# Patient Record
Sex: Male | Born: 1965 | Race: Black or African American | Hispanic: No | Marital: Married | Smoking: Never smoker
Health system: Southern US, Community
[De-identification: ages and names within clinical notes are randomized; demographics above are authoritative.]

## PROBLEM LIST (undated history)

## (undated) DIAGNOSIS — I10 Essential (primary) hypertension: Secondary | ICD-10-CM

## (undated) DIAGNOSIS — L409 Psoriasis, unspecified: Secondary | ICD-10-CM

---

## 2010-01-04 ENCOUNTER — Encounter: Admission: RE | Admit: 2010-01-04 | Discharge: 2010-01-04 | Payer: Self-pay | Admitting: Dermatology

## 2016-07-09 ENCOUNTER — Emergency Department (HOSPITAL_COMMUNITY)
Admission: EM | Admit: 2016-07-09 | Discharge: 2016-07-09 | Disposition: A | Discharge status: Home / Self Care | Payer: Managed Care, Other (non HMO) | Attending: Emergency Medicine | Admitting: Emergency Medicine

## 2016-07-09 ENCOUNTER — Encounter (HOSPITAL_COMMUNITY): Payer: Self-pay | Admitting: Emergency Medicine

## 2016-07-09 ENCOUNTER — Emergency Department (HOSPITAL_COMMUNITY): Payer: Managed Care, Other (non HMO)

## 2016-07-09 DIAGNOSIS — R1011 Right upper quadrant pain: Secondary | ICD-10-CM | POA: Insufficient documentation

## 2016-07-09 DIAGNOSIS — R7989 Other specified abnormal findings of blood chemistry: Secondary | ICD-10-CM

## 2016-07-09 DIAGNOSIS — R945 Abnormal results of liver function studies: Secondary | ICD-10-CM | POA: Insufficient documentation

## 2016-07-09 DIAGNOSIS — I10 Essential (primary) hypertension: Secondary | ICD-10-CM | POA: Diagnosis not present

## 2016-07-09 HISTORY — DX: Psoriasis, unspecified: L40.9

## 2016-07-09 HISTORY — DX: Essential (primary) hypertension: I10

## 2016-07-09 LAB — COMPREHENSIVE METABOLIC PANEL
ALT: 129 U/L — ABNORMAL HIGH (ref 17–63)
AST: 63 U/L — AB (ref 15–41)
Albumin: 4.4 g/dL (ref 3.5–5.0)
Alkaline Phosphatase: 40 U/L (ref 38–126)
Anion gap: 6 (ref 5–15)
BILIRUBIN TOTAL: 0.6 mg/dL (ref 0.3–1.2)
BUN: 20 mg/dL (ref 6–20)
CO2: 28 mmol/L (ref 22–32)
CREATININE: 1.58 mg/dL — AB (ref 0.61–1.24)
Calcium: 9.9 mg/dL (ref 8.9–10.3)
Chloride: 104 mmol/L (ref 101–111)
GFR calc Af Amer: 57 mL/min — ABNORMAL LOW (ref >60)
GFR, EST NON AFRICAN AMERICAN: 49 mL/min — AB (ref >60)
Glucose, Bld: 124 mg/dL — ABNORMAL HIGH (ref 65–99)
POTASSIUM: 4.4 mmol/L (ref 3.5–5.1)
Sodium: 138 mmol/L (ref 135–145)
TOTAL PROTEIN: 8.7 g/dL — AB (ref 6.5–8.1)

## 2016-07-09 LAB — CBC
HEMATOCRIT: 39.6 % (ref 39.0–52.0)
Hemoglobin: 12.9 g/dL — ABNORMAL LOW (ref 13.0–17.0)
MCH: 27.3 pg (ref 26.0–34.0)
MCHC: 32.6 g/dL (ref 30.0–36.0)
MCV: 83.7 fL (ref 78.0–100.0)
PLATELETS: 313 10*3/uL (ref 150–400)
RBC: 4.73 MIL/uL (ref 4.22–5.81)
RDW: 14.9 % (ref 11.5–15.5)
WBC: 11.2 10*3/uL — AB (ref 4.0–10.5)

## 2016-07-09 LAB — URINALYSIS, ROUTINE W REFLEX MICROSCOPIC
Bilirubin Urine: NEGATIVE
Glucose, UA: NEGATIVE mg/dL
Hgb urine dipstick: NEGATIVE
KETONES UR: NEGATIVE mg/dL
LEUKOCYTES UA: NEGATIVE
NITRITE: NEGATIVE
PROTEIN: 30 mg/dL — AB
Specific Gravity, Urine: 1.021 (ref 1.005–1.030)
pH: 5.5 (ref 5.0–8.0)

## 2016-07-09 LAB — URINE MICROSCOPIC-ADD ON: RBC / HPF: NONE SEEN RBC/hpf (ref 0–5)

## 2016-07-09 LAB — LIPASE, BLOOD: Lipase: 30 U/L (ref 11–51)

## 2016-07-09 MED ORDER — OXYCODONE-ACETAMINOPHEN 5-325 MG PO TABS: 2.0000 | ORAL_TABLET | Freq: Three times a day (TID) | ORAL | 0 refills | Status: DC | PRN

## 2016-07-09 MED ORDER — OXYCODONE-ACETAMINOPHEN 5-325 MG PO TABS
1.0000 | ORAL_TABLET | ORAL | Status: DC | PRN
Start: 2016-07-09 — End: 2016-07-09
  Administered 2016-07-09: 1 via ORAL
  Filled 2016-07-09: qty 1

## 2016-07-09 MED ORDER — OXYCODONE-ACETAMINOPHEN 5-325 MG PO TABS
1.0000 | ORAL_TABLET | Freq: Once | ORAL | Status: AC
  Administered 2016-07-09: 1 via ORAL
  Filled 2016-07-09: qty 1

## 2016-07-09 MED ORDER — ONDANSETRON 4 MG PO TBDP: 4.0000 mg | ORAL_TABLET | Freq: Three times a day (TID) | ORAL | 0 refills | Status: DC | PRN

## 2016-07-09 MED ORDER — ONDANSETRON 4 MG PO TBDP
4.0000 mg | ORAL_TABLET | Freq: Once | ORAL | Status: AC
  Administered 2016-07-09: 4 mg via ORAL
  Filled 2016-07-09: qty 1

## 2016-07-09 NOTE — Discharge Instructions (Signed)
Please follow up with your primary care provider for discussion of today's diagnosis. Your primary physician may want to repeat your liver lab work at this scheduled appointment. Zofran as needed for nausea. Pain medication only as needed for severe pain - This can make you very drowsy - please do not drink alcohol, operate heavy machinery or drive on this medication.  Please seek immediate care if you develop any of the following symptoms: The pain does not go away.  You have a fever.  You keep throwing up (vomiting). You pass bloody or black tarry stools.  There is bright red blood in the stool.  Constipation stays for more than 4 days.  There is rectal pain.  You do not seem to be getting better.  You have any questions or concerns.

## 2016-07-09 NOTE — ED Notes (Signed)
Discharge instructions, follow up care, and rx x2 reviewed with patient. Patient verbalized understanding. 

## 2016-07-09 NOTE — ED Provider Notes (Signed)
[facility] DEPT Provider Note   CSN: [id number] Arrival date & time: 07/09/16  0435     History   Chief Complaint Chief Complaint  Patient presents with  . Abdominal Pain    HPI Benjamin Castillo is a 50 y.o. male.   Abdominal Pain   Pertinent negatives include fever, diarrhea, nausea, vomiting, constipation, dysuria and headaches.   Benjamin Castillo is a 50 y.o. male  with a PMH of HTN, psoriasis who presents to the Emergency Department complaining of generalized abdominal pain that began last night around 10pm. Pain described as non-radiating, "soreness". No one location hurts more than another. He has BM's regularly, including this morning. No constipation/diarrhea or blood in the stool. No nausea. He states that he forced himself to vomit x 1 in hopes of making himself feel better, but this did not help. No other episodes of emesis. No fevers, chills, back pain, shortness of breath, dysuria. Pepto-bismol taken with no relief. Zofran and percocet given in triage which did improve symptoms.    Past Medical History:  Diagnosis Date  . Hypertension   . Psoriasis     There are no active problems to display for this patient.   History reviewed. No pertinent surgical history.     Home Medications    Prior to Admission medications   Medication Sig Start Date End Date Taking? Authorizing Provider  ondansetron (ZOFRAN ODT) 4 MG disintegrating tablet Take 1 tablet (4 mg total) by mouth every 8 (eight) hours as needed for nausea or vomiting. 07/09/16   Ozella Almond Ward, PA-C  oxyCODONE-acetaminophen (PERCOCET/ROXICET) 5-325 MG tablet Take 2 tablets by mouth every 8 (eight) hours as needed for severe pain. 07/09/16   [place], PA-C    Family History History reviewed. No pertinent family history.  Social History Social History  Substance Use Topics  . Smoking status: Never Smoker  . Smokeless tobacco: Never Used  . Alcohol use No     Allergies   Patient has  no known allergies.   Review of Systems Review of Systems  Constitutional: Negative for chills and fever.  HENT: Negative for congestion.   Eyes: Negative for visual disturbance.  Respiratory: Negative for cough and shortness of breath.   Cardiovascular: Negative.   Gastrointestinal: Positive for abdominal pain. Negative for blood in stool, constipation, diarrhea, nausea and vomiting.  Genitourinary: Negative for dysuria.  Musculoskeletal: Negative for back pain.  Skin: Negative for rash.  Neurological: Negative for headaches.     Physical Exam Updated Vital Signs BP 147/95   Pulse 88   Temp 98.8 F (37.1 C) (Oral)   Resp 18   Ht 5\' 8"  (1.727 m)   Wt 95.3 kg   SpO2 99%   BMI 31.93 kg/m   Physical Exam  Constitutional: He is oriented to person, place, and time. He appears well-developed and well-nourished. No distress.  HENT:  Head: Normocephalic and atraumatic.  Cardiovascular: Normal rate, regular rhythm and normal heart sounds.   No murmur heard. Pulmonary/Chest: Effort normal and breath sounds normal. No respiratory distress.  Abdominal: Soft. Bowel sounds are normal. He exhibits no distension. There is no rebound and no guarding. No hernia.  Mild generalized abdominal tenderness. No focal areas of tenderness or point tenderness at McBurney's. Negative Murphy's. No CVA tenderness.   Musculoskeletal: Normal range of motion.  Neurological: He is alert and oriented to person, place, and time.  Skin: Skin is warm and dry.  Nursing note and vitals reviewed.  ED Treatments / Results  Labs (all labs ordered are listed, but only abnormal results are displayed) Labs Reviewed  COMPREHENSIVE METABOLIC PANEL - Abnormal; Notable for the following:       Result Value   Glucose, Bld 124 (*)    Creatinine, Ser 1.58 (*)    Total Protein 8.7 (*)    AST 63 (*)    ALT 129 (*)    GFR calc non Af Amer 49 (*)    GFR calc Af Amer 57 (*)    All other components within normal  limits  CBC - Abnormal; Notable for the following:    WBC 11.2 (*)    Hemoglobin 12.9 (*)    All other components within normal limits  URINALYSIS, ROUTINE W REFLEX MICROSCOPIC (NOT AT [facility]) - Abnormal; Notable for the following:    Protein, ur 30 (*)    All other components within normal limits  URINE MICROSCOPIC-ADD ON - Abnormal; Notable for the following:    Squamous Epithelial / LPF 0-5 (*)    Bacteria, UA RARE (*)    All other components within normal limits  LIPASE, BLOOD    EKG  EKG Interpretation None       Radiology US Abdomen Limited Ruq  Result Date: 07/09/2016 CLINICAL DATA:  Acute right upper quadrant abdominal pain. EXAM: US ABDOMEN LIMITED - RIGHT UPPER QUADRANT COMPARISON:  None. FINDINGS: Gallbladder: No gallstones or wall thickening visualized. No sonographic Murphy sign noted by sonographer. Common bile duct: Diameter: 5 mm which is within normal limits. Liver: No focal lesion identified. Increased echogenicity of hepatic parenchyma is noted consistent with fatty infiltration. IMPRESSION: Fatty infiltration of the liver. No other abnormality seen in the right upper quadrant of the abdomen. Electronically Signed   By: Marijo Conception, M.D.   On: 07/09/2016 08:25    Procedures Procedures (including critical care time)  Medications Ordered in ED Medications  ondansetron (ZOFRAN-ODT) disintegrating tablet 4 mg (4 mg Oral Given 07/09/16 0538)  oxyCODONE-acetaminophen (PERCOCET/ROXICET) 5-325 MG per tablet 1 tablet (1 tablet Oral Given 07/09/16 [id number])     Initial Impression / Assessment and Plan / ED Course  I have reviewed the triage vital signs and the nursing notes.  Pertinent labs & imaging results that were available during my care of the patient were reviewed by me and considered in my medical decision making (see chart for details).  Clinical Course    Benjamin Castillo is a 50 y.o. male who presents to ED for generalized abdominal pain beginning last  night. On exam, patient is non-toxic appearing, hemodynamically stable with a nonsurgical abdomen. Generalized abdominal tenderness and focal areas of tenderness. All labs reviewed. Urine is infection, white count of 11.2. Patient does have elevated creatinine, but states that his primary care provider has told he has "bad kidney labs because of my psoriasis medicine".  This is likely baseline. Mildly elevated AST/ALT.  7:52 AM - Patient re-evaluated and repeat abdominal exam performed. Patient states pain medication has worn off. Exam with much more tenderness localized to the RUQ. Still negative murphy's with no rebound or guarding. Will obtain US and continue to monitor.    [place] reviewed and shows fatty liver with no other abnormalities. Patient has PCP who he agrees to follow up with next week or two for elevated AST/ALT. Abdominal pain return precautions were discussed. Patient and significant other at bedside express understanding and agreement with follow up care and return plan. Rx for zofran given. All questions  answered.    Final Clinical Impressions(s) / ED Diagnoses   Final diagnoses:  RUQ pain  Elevated LFTs    New Prescriptions Discharge Medication List as of 07/09/2016  8:47 AM    START taking these medications   Details  ondansetron (ZOFRAN ODT) 4 MG disintegrating tablet Take 1 tablet (4 mg total) by mouth every 8 (eight) hours as needed for nausea or vomiting., Starting Wed 07/09/2016, Print    oxyCODONE-acetaminophen (PERCOCET/ROXICET) 5-325 MG tablet Take 2 tablets by mouth every 8 (eight) hours as needed for severe pain., Starting Wed 07/09/2016, Print         [employer] Ward, PA-C 07/09/16 [id number]    Everlene Balls, MD 07/09/16 1330

## 2016-07-09 NOTE — ED Triage Notes (Signed)
Patient complaining of abdominal pain that started about 10:30 last night. Patient says his whole abdomen is painful. Patient vomited last night. Patient took some pepto bismol. Patient had no injury to his abdomen.

## 2016-07-09 NOTE — ED Notes (Signed)
Ultrasound at bedside

## 2017-02-16 ENCOUNTER — Encounter (HOSPITAL_COMMUNITY): Payer: Self-pay | Admitting: Emergency Medicine

## 2017-02-16 DIAGNOSIS — Z79899 Other long term (current) drug therapy: Secondary | ICD-10-CM | POA: Insufficient documentation

## 2017-02-16 DIAGNOSIS — K5730 Diverticulosis of large intestine without perforation or abscess without bleeding: Secondary | ICD-10-CM | POA: Diagnosis not present

## 2017-02-16 DIAGNOSIS — L409 Psoriasis, unspecified: Secondary | ICD-10-CM | POA: Insufficient documentation

## 2017-02-16 DIAGNOSIS — I10 Essential (primary) hypertension: Secondary | ICD-10-CM | POA: Diagnosis not present

## 2017-02-16 DIAGNOSIS — K3580 Unspecified acute appendicitis: Principal | ICD-10-CM | POA: Insufficient documentation

## 2017-02-16 DIAGNOSIS — I700 Atherosclerosis of aorta: Secondary | ICD-10-CM | POA: Diagnosis not present

## 2017-02-16 DIAGNOSIS — R109 Unspecified abdominal pain: Secondary | ICD-10-CM | POA: Diagnosis present

## 2017-02-16 NOTE — ED Triage Notes (Signed)
Patient reports generalized abdominal pain with mild nausea onset this evening , denies emesis or diarrhea , no fever or chills .

## 2017-02-17 ENCOUNTER — Emergency Department (HOSPITAL_COMMUNITY): Payer: Managed Care, Other (non HMO) | Admitting: Certified Registered Nurse Anesthetist

## 2017-02-17 ENCOUNTER — Emergency Department (HOSPITAL_COMMUNITY): Payer: Managed Care, Other (non HMO)

## 2017-02-17 ENCOUNTER — Encounter (HOSPITAL_COMMUNITY): Payer: Self-pay | Admitting: Radiology

## 2017-02-17 ENCOUNTER — Observation Stay (HOSPITAL_COMMUNITY)
Admission: EM | Admit: 2017-02-17 | Discharge: 2017-02-18 | Disposition: A | Discharge status: Home / Self Care | Payer: Managed Care, Other (non HMO) | Attending: General Surgery | Admitting: General Surgery

## 2017-02-17 ENCOUNTER — Encounter (HOSPITAL_COMMUNITY)
Admission: EM | Disposition: A | Discharge status: Home / Self Care | Payer: Self-pay | Source: Home / Self Care | Attending: Emergency Medicine

## 2017-02-17 DIAGNOSIS — K3580 Unspecified acute appendicitis: Secondary | ICD-10-CM | POA: Diagnosis present

## 2017-02-17 DIAGNOSIS — R109 Unspecified abdominal pain: Secondary | ICD-10-CM

## 2017-02-17 HISTORY — PX: LAPAROSCOPIC APPENDECTOMY: SHX408

## 2017-02-17 LAB — CBC
HCT: 37.8 % — ABNORMAL LOW (ref 39.0–52.0)
HEMOGLOBIN: 12.2 g/dL — AB (ref 13.0–17.0)
MCH: 27.2 pg (ref 26.0–34.0)
MCHC: 32.3 g/dL (ref 30.0–36.0)
MCV: 84.4 fL (ref 78.0–100.0)
PLATELETS: 228 10*3/uL (ref 150–400)
RBC: 4.48 MIL/uL (ref 4.22–5.81)
RDW: 14.4 % (ref 11.5–15.5)
WBC: 9.4 10*3/uL (ref 4.0–10.5)

## 2017-02-17 LAB — COMPREHENSIVE METABOLIC PANEL
ALT: 226 U/L — ABNORMAL HIGH (ref 17–63)
AST: 111 U/L — ABNORMAL HIGH (ref 15–41)
Albumin: 4.2 g/dL (ref 3.5–5.0)
Alkaline Phosphatase: 36 U/L — ABNORMAL LOW (ref 38–126)
Anion gap: 6 (ref 5–15)
BUN: 19 mg/dL (ref 6–20)
CO2: 29 mmol/L (ref 22–32)
Calcium: 9.6 mg/dL (ref 8.9–10.3)
Chloride: 102 mmol/L (ref 101–111)
Creatinine, Ser: 1.73 mg/dL — ABNORMAL HIGH (ref 0.61–1.24)
GFR calc Af Amer: 51 mL/min — ABNORMAL LOW (ref >60)
GFR calc non Af Amer: 44 mL/min — ABNORMAL LOW (ref >60)
Glucose, Bld: 118 mg/dL — ABNORMAL HIGH (ref 65–99)
Potassium: 3.6 mmol/L (ref 3.5–5.1)
Sodium: 137 mmol/L (ref 135–145)
Total Bilirubin: 0.6 mg/dL (ref 0.3–1.2)
Total Protein: 8.4 g/dL — ABNORMAL HIGH (ref 6.5–8.1)

## 2017-02-17 LAB — URINALYSIS, ROUTINE W REFLEX MICROSCOPIC
Bilirubin Urine: NEGATIVE
Glucose, UA: NEGATIVE mg/dL
Hgb urine dipstick: NEGATIVE
Ketones, ur: NEGATIVE mg/dL
Leukocytes, UA: NEGATIVE
Nitrite: NEGATIVE
Protein, ur: NEGATIVE mg/dL
Specific Gravity, Urine: 1.026 (ref 1.005–1.030)
pH: 5 (ref 5.0–8.0)

## 2017-02-17 LAB — LIPASE, BLOOD: LIPASE: 37 U/L (ref 11–51)

## 2017-02-17 SURGERY — APPENDECTOMY, LAPAROSCOPIC
Anesthesia: General

## 2017-02-17 MED ORDER — MIDAZOLAM HCL 5 MG/5ML IJ SOLN
INTRAMUSCULAR | Status: DC | PRN
  Administered 2017-02-17: 2 mg via INTRAVENOUS

## 2017-02-17 MED ORDER — ENOXAPARIN SODIUM 40 MG/0.4ML ~~LOC~~ SOLN
40.0000 mg | SUBCUTANEOUS | Status: DC
  Administered 2017-02-18: 40 mg via SUBCUTANEOUS
  Filled 2017-02-17: qty 0.4

## 2017-02-17 MED ORDER — KCL IN DEXTROSE-NACL 20-5-0.9 MEQ/L-%-% IV SOLN
INTRAVENOUS | Status: DC
  Administered 2017-02-17 – 2017-02-18 (×2): via INTRAVENOUS
  Filled 2017-02-17 (×4): qty 1000

## 2017-02-17 MED ORDER — 0.9 % SODIUM CHLORIDE (POUR BTL) OPTIME
TOPICAL | Status: DC | PRN
  Administered 2017-02-17: 1000 mL

## 2017-02-17 MED ORDER — FENTANYL CITRATE (PF) 100 MCG/2ML IJ SOLN: 25.0000 ug | INTRAMUSCULAR | Status: DC | PRN

## 2017-02-17 MED ORDER — FENTANYL CITRATE (PF) 100 MCG/2ML IJ SOLN: 50.0000 ug | INTRAMUSCULAR | Status: DC | PRN

## 2017-02-17 MED ORDER — PROPOFOL 10 MG/ML IV BOLUS
INTRAVENOUS | Status: AC
  Filled 2017-02-17: qty 40

## 2017-02-17 MED ORDER — ONDANSETRON HCL 4 MG/2ML IJ SOLN: 4.0000 mg | Freq: Four times a day (QID) | INTRAMUSCULAR | Status: DC | PRN

## 2017-02-17 MED ORDER — SUGAMMADEX SODIUM 200 MG/2ML IV SOLN
INTRAVENOUS | Status: AC
  Filled 2017-02-17: qty 2

## 2017-02-17 MED ORDER — DEXTROSE 5 % IV SOLN
2.0000 g | Freq: Once | INTRAVENOUS | Status: AC
  Administered 2017-02-17: 2 g via INTRAVENOUS
  Filled 2017-02-17: qty 2

## 2017-02-17 MED ORDER — IOPAMIDOL (ISOVUE-300) INJECTION 61%
INTRAVENOUS | Status: AC
  Administered 2017-02-17: 80 mL
  Filled 2017-02-17: qty 100

## 2017-02-17 MED ORDER — HYDROMORPHONE HCL 1 MG/ML IJ SOLN
1.0000 mg | Freq: Once | INTRAMUSCULAR | Status: AC
  Administered 2017-02-17: 1 mg via INTRAVENOUS
  Filled 2017-02-17: qty 1

## 2017-02-17 MED ORDER — OXYCODONE HCL 5 MG PO TABS
5.0000 mg | ORAL_TABLET | ORAL | Status: DC | PRN
  Administered 2017-02-18: 5 mg via ORAL
  Administered 2017-02-18: 10 mg via ORAL
  Filled 2017-02-17 (×3): qty 1

## 2017-02-17 MED ORDER — BUPIVACAINE-EPINEPHRINE (PF) 0.5% -1:200000 IJ SOLN
INTRAMUSCULAR | Status: AC
  Filled 2017-02-17: qty 30

## 2017-02-17 MED ORDER — METOPROLOL TARTRATE 5 MG/5ML IV SOLN: 5.0000 mg | Freq: Four times a day (QID) | INTRAVENOUS | Status: DC | PRN

## 2017-02-17 MED ORDER — OXYCODONE HCL 5 MG/5ML PO SOLN: 5.0000 mg | Freq: Once | ORAL | Status: DC | PRN

## 2017-02-17 MED ORDER — BUPIVACAINE-EPINEPHRINE 0.5% -1:200000 IJ SOLN
INTRAMUSCULAR | Status: DC | PRN
  Administered 2017-02-17: 8 mL

## 2017-02-17 MED ORDER — HEMOSTATIC AGENTS (NO CHARGE) OPTIME
TOPICAL | Status: DC | PRN
  Administered 2017-02-17: 1 via TOPICAL
  Administered 2017-02-17: 2 via TOPICAL

## 2017-02-17 MED ORDER — FENTANYL CITRATE (PF) 100 MCG/2ML IJ SOLN
INTRAMUSCULAR | Status: DC | PRN
  Administered 2017-02-17 (×5): 50 ug via INTRAVENOUS

## 2017-02-17 MED ORDER — ROCURONIUM BROMIDE 10 MG/ML (PF) SYRINGE
PREFILLED_SYRINGE | INTRAVENOUS | Status: DC | PRN
  Administered 2017-02-17: 10 mg via INTRAVENOUS
  Administered 2017-02-17: 50 mg via INTRAVENOUS

## 2017-02-17 MED ORDER — PROPOFOL 10 MG/ML IV BOLUS
INTRAVENOUS | Status: DC | PRN
  Administered 2017-02-17: 200 mg via INTRAVENOUS

## 2017-02-17 MED ORDER — LIDOCAINE 2% (20 MG/ML) 5 ML SYRINGE
INTRAMUSCULAR | Status: DC | PRN
  Administered 2017-02-17: 60 mg via INTRAVENOUS

## 2017-02-17 MED ORDER — ACETAMINOPHEN 650 MG RE SUPP: 650.0000 mg | Freq: Four times a day (QID) | RECTAL | Status: DC | PRN

## 2017-02-17 MED ORDER — ONDANSETRON HCL 4 MG/2ML IJ SOLN
INTRAMUSCULAR | Status: DC | PRN
  Administered 2017-02-17: 4 mg via INTRAVENOUS

## 2017-02-17 MED ORDER — OXYCODONE HCL 5 MG PO TABS: 5.0000 mg | ORAL_TABLET | Freq: Once | ORAL | Status: DC | PRN

## 2017-02-17 MED ORDER — ONDANSETRON HCL 4 MG/2ML IJ SOLN
4.0000 mg | Freq: Once | INTRAMUSCULAR | Status: AC
  Administered 2017-02-17: 4 mg via INTRAVENOUS
  Filled 2017-02-17: qty 2

## 2017-02-17 MED ORDER — MIDAZOLAM HCL 2 MG/2ML IJ SOLN
INTRAMUSCULAR | Status: AC
  Filled 2017-02-17: qty 2

## 2017-02-17 MED ORDER — METRONIDAZOLE IN NACL 5-0.79 MG/ML-% IV SOLN
500.0000 mg | Freq: Once | INTRAVENOUS | Status: AC
  Administered 2017-02-17: 500 mg via INTRAVENOUS
  Filled 2017-02-17: qty 100

## 2017-02-17 MED ORDER — FENTANYL CITRATE (PF) 250 MCG/5ML IJ SOLN
INTRAMUSCULAR | Status: AC
  Filled 2017-02-17: qty 5

## 2017-02-17 MED ORDER — SODIUM CHLORIDE 0.9 % IR SOLN
Status: DC | PRN
  Administered 2017-02-17: 1000 mL

## 2017-02-17 MED ORDER — DEXAMETHASONE SODIUM PHOSPHATE 10 MG/ML IJ SOLN
INTRAMUSCULAR | Status: AC
  Filled 2017-02-17: qty 1

## 2017-02-17 MED ORDER — SUGAMMADEX SODIUM 200 MG/2ML IV SOLN
INTRAVENOUS | Status: DC | PRN
  Administered 2017-02-17: 200 mg via INTRAVENOUS

## 2017-02-17 MED ORDER — LACTATED RINGERS IV SOLN
INTRAVENOUS | Status: DC | PRN
  Administered 2017-02-17 (×3): via INTRAVENOUS

## 2017-02-17 MED ORDER — ACETAMINOPHEN 325 MG PO TABS: 650.0000 mg | ORAL_TABLET | Freq: Four times a day (QID) | ORAL | Status: DC | PRN

## 2017-02-17 MED ORDER — SODIUM CHLORIDE 0.9 % IV BOLUS (SEPSIS)
1000.0000 mL | Freq: Once | INTRAVENOUS | Status: AC
  Administered 2017-02-17: 1000 mL via INTRAVENOUS

## 2017-02-17 MED ORDER — ONDANSETRON 4 MG PO TBDP: 4.0000 mg | ORAL_TABLET | Freq: Four times a day (QID) | ORAL | Status: DC | PRN

## 2017-02-17 MED ORDER — DEXAMETHASONE SODIUM PHOSPHATE 10 MG/ML IJ SOLN
INTRAMUSCULAR | Status: DC | PRN
  Administered 2017-02-17: 10 mg via INTRAVENOUS

## 2017-02-17 MED ORDER — LISINOPRIL 10 MG PO TABS
10.0000 mg | ORAL_TABLET | Freq: Every day | ORAL | Status: DC
  Administered 2017-02-17 – 2017-02-18 (×2): 10 mg via ORAL
  Filled 2017-02-17 (×2): qty 1

## 2017-02-17 MED ORDER — METHOCARBAMOL 500 MG PO TABS: 500.0000 mg | ORAL_TABLET | Freq: Four times a day (QID) | ORAL | Status: DC | PRN

## 2017-02-17 SURGICAL SUPPLY — 47 items
APPLIER CLIP ROT 10 11.4 M/L (STAPLE)
BLADE CLIPPER SURG (BLADE) ×3 IMPLANT
CANISTER SUCT 3000ML PPV (MISCELLANEOUS) ×3 IMPLANT
CHLORAPREP W/TINT 26ML (MISCELLANEOUS) ×3 IMPLANT
CLIP APPLIE ROT 10 11.4 M/L (STAPLE) IMPLANT
COVER SURGICAL LIGHT HANDLE (MISCELLANEOUS) ×3 IMPLANT
CUTTER FLEX LINEAR 45M (STAPLE) ×3 IMPLANT
DERMABOND ADVANCED (GAUZE/BANDAGES/DRESSINGS) ×2
DERMABOND ADVANCED .7 DNX12 (GAUZE/BANDAGES/DRESSINGS) ×1 IMPLANT
DRAPE WARM FLUID 44X44 (DRAPE) ×3 IMPLANT
ELECT REM PT RETURN 9FT ADLT (ELECTROSURGICAL) ×3
ELECTRODE REM PT RTRN 9FT ADLT (ELECTROSURGICAL) ×1 IMPLANT
ENDOLOOP SUT PDS II  0 18 (SUTURE)
ENDOLOOP SUT PDS II 0 18 (SUTURE) IMPLANT
GLOVE BIO SURGEON STRL SZ7.5 (GLOVE) ×3 IMPLANT
GLOVE BIO SURGEON STRL SZ8 (GLOVE) ×3 IMPLANT
GLOVE BIOGEL PI IND STRL 6.5 (GLOVE) ×1 IMPLANT
GLOVE BIOGEL PI IND STRL 7.5 (GLOVE) ×1 IMPLANT
GLOVE BIOGEL PI IND STRL 8 (GLOVE) ×1 IMPLANT
GLOVE BIOGEL PI INDICATOR 6.5 (GLOVE) ×2
GLOVE BIOGEL PI INDICATOR 7.5 (GLOVE) ×2
GLOVE BIOGEL PI INDICATOR 8 (GLOVE) ×2
GOWN STRL REUS W/ TWL LRG LVL3 (GOWN DISPOSABLE) ×2 IMPLANT
GOWN STRL REUS W/ TWL XL LVL3 (GOWN DISPOSABLE) ×1 IMPLANT
GOWN STRL REUS W/TWL LRG LVL3 (GOWN DISPOSABLE) ×4
GOWN STRL REUS W/TWL XL LVL3 (GOWN DISPOSABLE) ×2
HEMOSTAT SNOW SURGICEL 2X4 (HEMOSTASIS) ×9 IMPLANT
KIT BASIN OR (CUSTOM PROCEDURE TRAY) ×3 IMPLANT
KIT ROOM TURNOVER OR (KITS) ×3 IMPLANT
NS IRRIG 1000ML POUR BTL (IV SOLUTION) ×3 IMPLANT
PAD ARMBOARD 7.5X6 YLW CONV (MISCELLANEOUS) ×6 IMPLANT
POUCH SPECIMEN RETRIEVAL 10MM (ENDOMECHANICALS) ×3 IMPLANT
RELOAD STAPLE TA45 3.5 REG BLU (ENDOMECHANICALS) ×3 IMPLANT
SCISSORS LAP 5X35 DISP (ENDOMECHANICALS) ×3 IMPLANT
SET IRRIG TUBING LAPAROSCOPIC (IRRIGATION / IRRIGATOR) ×3 IMPLANT
SHEARS HARMONIC ACE PLUS 36CM (ENDOMECHANICALS) ×3 IMPLANT
SPECIMEN JAR SMALL (MISCELLANEOUS) ×3 IMPLANT
SUT MON AB 4-0 PC3 18 (SUTURE) ×3 IMPLANT
TOWEL GREEN STERILE FF (TOWEL DISPOSABLE) ×3 IMPLANT
TOWEL OR 17X24 6PK STRL BLUE (TOWEL DISPOSABLE) IMPLANT
TOWEL OR 17X26 10 PK STRL BLUE (TOWEL DISPOSABLE) IMPLANT
TRAY FOLEY CATH SILVER 16FR (SET/KITS/TRAYS/PACK) ×3 IMPLANT
TRAY LAPAROSCOPIC MC (CUSTOM PROCEDURE TRAY) ×3 IMPLANT
TROCAR XCEL BLADELESS 5X75MML (TROCAR) ×6 IMPLANT
TROCAR XCEL BLUNT TIP 100MML (ENDOMECHANICALS) ×3 IMPLANT
TUBING INSUFFLATION (TUBING) ×3 IMPLANT
WATER STERILE IRR 1000ML POUR (IV SOLUTION) ×3 IMPLANT

## 2017-02-17 NOTE — ED Notes (Addendum)
Asked pt for a urine sample and pt stated "I just used the bathroom" told pt that it was ok and whenever he had to go again to just let us know. Pt expressed understanding

## 2017-02-17 NOTE — Op Note (Signed)
Appendectomy, Lap, Procedure Note  Indications: The patient presented with a history of right-sided abdominal pain. A CT revealed findings consistent with acute appendicitis.The procedure has been discussed with the patient.  Alternative therapies have been discussed with the patient.  Operative risks include bleeding,  Infection,  Organ injury,  Nerve injury,  Blood vessel injury,  DVT,  Pulmonary embolism,  Death,  And possible reoperation.  Medical management risks include worsening of present situation.  The success of the procedure is 50 -90 % at treating patients symptoms.  The patient understands and agrees to proceed.  Pre-operative Diagnosis: Acute appendicitis without mention of peritonitis  Post-operative Diagnosis: Same  Surgeon: Demaris Bousquet A.   Assistants:  Deon Pilling RNFA  Anesthesia: General endotracheal anesthesia and Local anesthesia 0.25.% bupivacaine, with epinephrine  ASA Class: 2  Procedure Details  The patient was seen again in the Holding Room. The risks, benefits, complications, treatment options, and expected outcomes were discussed with the patient and/or family. The possibilities of reaction to medication, pulmonary aspiration, perforation of viscus, bleeding, recurrent infection, finding a normal appendix, the need for additional procedures, failure to diagnose a condition, and creating a complication requiring transfusion or operation were discussed. There was concurrence with the proposed plan and informed consent was obtained. The site of surgery was properly noted/marked. The patient was taken to Operating Room, identified as Benjamin Castillo and the procedure verified as Appendectomy. A Time Out was held and the above information confirmed.  The patient was placed in the supine position and general anesthesia was induced, along with placement of orogastric tube, Venodyne boots, and a Foley catheter. The abdomen was prepped and draped in a sterile fashion. A one  centimeter infraumbilical incision was made and the peritoneal cavity was accessed using the OPEN  technique. The pneumoperitoneum was then established to steady pressure of 12 mmHg. A 12 mm port was placed through the umbilical incision. Additional 5 mm cannulas then placed in the  lower midline of the abdomen and half way between the umbilicus and xyphoid process under direct vision. A careful evaluation of the entire abdomen was carried out. The patient was placed in Trendelenburg and left lateral decubitus position. The small intestines were retracted in the cephalad and left lateral direction away from the pelvis and right lower quadrant. The patient was found to have an enlarged and inflamed appendix that was extending into the pelvis. There was no evidence of perforation.  The appendix was carefully dissected. A window was made in the mesoappendix at the base of the appendix. A harmonic scalpel was used across the mesoappendix. The appendix was divided at its base using an endo-GIA stapler. Minimal appendiceal stump was left in place.  Clips were placed across the mesoappendix and Surgicel was placed due to oozing with hemostasis achieved. There was no evidence of bleeding, leakage, or complication after division of the appendix. Irrigation was also performed and irrigate suctioned from the abdomen as well.  The umbilical port site was closed using 0 vicryl pursestring sutures fashion at the level of the fascia. The trocar site skin wounds were closed using 4 0 monocryl.  Instrument, sponge, and needle counts were correct at the conclusion of the case.   Findings: The appendix was found to be inflamed. There were not signs of necrosis.  There was not perforation. There was not abscess formation.  Estimated Blood Loss:  less than 100 mL         Drains: NONE  Total IV Fluids: PER anesthesia record         Specimens: appendix         Complications:  None; patient tolerated the procedure  well.         Disposition: PACU - hemodynamically stable.         Condition: stable

## 2017-02-17 NOTE — Progress Notes (Signed)
Assumed care of patient after receiving handoff report from previous nurse.

## 2017-02-17 NOTE — Anesthesia Procedure Notes (Signed)
Procedure Name: Intubation Date/Time: 02/17/2017 9:48 AM Performed by: Everlean Cherry A Pre-anesthesia Checklist: Patient identified, Emergency Drugs available, Suction available and Patient being monitored Patient Re-evaluated:Patient Re-evaluated prior to inductionOxygen Delivery Method: Circle system utilized Preoxygenation: Pre-oxygenation with 100% oxygen Intubation Type: IV induction Ventilation: Mask ventilation without difficulty and Oral airway inserted - appropriate to patient size Laryngoscope Size: Mac and 4 Grade View: Grade II Tube type: Oral Tube size: 8.0 mm Number of attempts: 2 Airway Equipment and Method: Stylet Placement Confirmation: ETT inserted through vocal cords under direct vision,  positive ETCO2 and breath sounds checked- equal and bilateral Secured at: 24 cm Tube secured with: Tape Dental Injury: Teeth and Oropharynx as per pre-operative assessment  Comments: DL x 1 with Mil 2.  Grade 3 view.  Unable to pass ETT.  DL x2 with MAC 4 by MD.  Grade 2 view.  EBBS and VSS.

## 2017-02-17 NOTE — Discharge Instructions (Signed)
Please arrive at least 30 min before your appointment to complete your check in paperwork.  If you are unable to arrive 30 min prior to your appointment time we may have to cancel or reschedule you. ° °LAPAROSCOPIC SURGERY: POST OP INSTRUCTIONS  °1. DIET: Follow a light bland diet the first 24 hours after arrival home, such as soup, liquids, crackers, etc. Be sure to include lots of fluids daily. Avoid fast food or heavy meals as your are more likely to get nauseated. Eat a low fat the next few days after surgery.  °2. Take your usually prescribed home medications unless otherwise directed. °3. PAIN CONTROL:  °1. Pain is best controlled by a usual combination of three different methods TOGETHER:  °1. Ice/Heat °2. Over the counter pain medication °3. Prescription pain medication °2. Most patients will experience some swelling and bruising around the incisions. Ice packs or heating pads (30-60 minutes up to 6 times a day) will help. Use ice for the first few days to help decrease swelling and bruising, then switch to heat to help relax tight/sore spots and speed recovery. Some people prefer to use ice alone, heat alone, alternating between ice & heat. Experiment to what works for you. Swelling and bruising can take several weeks to resolve.  °3. It is helpful to take an over-the-counter pain medication regularly for the first few weeks. Choose one of the following that works best for you:  °1. Naproxen (Aleve, etc) Two 220mg tabs twice a day °2. Ibuprofen (Advil, etc) Three 200mg tabs four times a day (every meal & bedtime) °3. Acetaminophen (Tylenol, etc) 500-650mg four times a day (every meal & bedtime) °4. A prescription for pain medication (such as oxycodone, hydrocodone, etc) should be given to you upon discharge. Take your pain medication as prescribed.  °1. If you are having problems/concerns with the prescription medicine (does not control pain, nausea, vomiting, rash, itching, etc), please call us [phone]  [phone] to see if we need to switch you to a different pain medicine that will work better for you and/or control your side effect better. °2. If you need a refill on your pain medication, please contact your pharmacy. They will contact our office to request authorization. Prescriptions will not be filled after 5 pm or on week-ends. °4. Avoid getting constipated. Between the surgery and the pain medications, it is common to experience some constipation. Increasing fluid intake and taking a fiber supplement (such as Metamucil, Citrucel, FiberCon, MiraLax, etc) 1-2 times a day regularly will usually help prevent this problem from occurring. A mild laxative (prune juice, Milk of Magnesia, MiraLax, etc) should be taken according to package directions if there are no bowel movements after 48 hours.  °5. Watch out for diarrhea. If you have many loose bowel movements, simplify your diet to bland foods & liquids for a few days. Stop any stool softeners and decrease your fiber supplement. Switching to mild anti-diarrheal medications (Kayopectate, Pepto Bismol) can help. If this worsens or does not improve, please call us. °6. Wash / shower every day. You may shower over the dressings as they are waterproof. Continue to shower over incision(s) after the dressing is off. °7. Remove your waterproof bandages 5 days after surgery. You may leave the incision open to air. You may replace a dressing/Band-Aid to cover the incision for comfort if you wish.  °8. ACTIVITIES as tolerated:  °1. You may resume regular (light) daily activities beginning the next day--such as daily self-care, walking, climbing stairs--gradually   increasing activities as tolerated. If you can walk 30 minutes without difficulty, it is safe to try more intense activity such as jogging, treadmill, bicycling, low-impact aerobics, swimming, etc. °2. Save the most intensive and strenuous activity for last such as sit-ups, heavy lifting, contact sports, etc Refrain  from any heavy lifting or straining until you are off narcotics for pain control.  °3. DO NOT PUSH THROUGH PAIN. Let pain be your guide: If it hurts to do something, don't do it. Pain is your body warning you to avoid that activity for another week until the pain goes down. °4. You may drive when you are no longer taking prescription pain medication, you can comfortably wear a seatbelt, and you can safely maneuver your car and apply brakes. °5. You may have sexual intercourse when it is comfortable.  °9. FOLLOW UP in our office  °1. Please call [facility] at [phone] to set up an appointment to see your surgeon in the office for a follow-up appointment approximately 2-3 weeks after your surgery. °2. Make sure that you call for this appointment the day you arrive home to insure a convenient appointment time. °     10. IF YOU HAVE DISABILITY OR FAMILY LEAVE FORMS, BRING THEM TO THE               OFFICE FOR PROCESSING.  ° °WHEN TO CALL US [phone]:  °1. Poor pain control °2. Reactions / problems with new medications (rash/itching, nausea, etc)  °3. Fever over 101.5 F (38.5 C) °4. Inability to urinate °5. Nausea and/or vomiting °6. Worsening swelling or bruising °7. Continued bleeding from incision. °8. Increased pain, redness, or drainage from the incision ° °The clinic staff is available to answer your questions during regular business hours (8:30am-5pm). Please don’t hesitate to call and ask to speak to one of our nurses for clinical concerns.  °If you have a medical emergency, go to the nearest emergency room or call 911.  °A surgeon from [facility] is always on call at the hospitals  ° °[facility]  °[address], [address], [place], [place] [address] ?  °MAIN: [phone] ? TOLL FREE: [phone] ?  °FAX [phone]  °[email or web address] ° °

## 2017-02-17 NOTE — ED Provider Notes (Signed)
[facility] DEPT Provider Note   CSN: [id number] Arrival date & time: 02/16/17  2338   By signing my name below, I, Benjamin Castillo, attest that this documentation has been prepared under the direction and in the presence of Pollina, Gwenyth Allegra, * . Electronically Signed: Evelene Castillo, Scribe. 02/17/2017. 3:55 AM.   History   Chief Complaint Chief Complaint  Patient presents with  . Abdominal Pain    The history is provided by the patient. No language interpreter was used.     HPI Comments:  Benjamin Castillo is a 51 y.o. male who presents to the Emergency Department complaining of generalized abdominal pain which began yesterday. Pt reports associated mild  nausea. He denies fever, vomiting and diarrhea. No alleviating factors noted. He notes h/o similar pain ~ 9 months ago. He was evaluated in the ED and had an [place] that showed a fatty liver with no other abnormalities. Patient was discharged home with Zofran and Percocet and was advised to follow up with his PCP for elevated AST/ALT.  Past Medical History:  Diagnosis Date  . Hypertension   . Psoriasis     There are no active problems to display for this patient.   History reviewed. No pertinent surgical history.     Home Medications    Prior to Admission medications   Medication Sig Start Date End Date Taking? Authorizing Provider  lisinopril (PRINIVIL,ZESTRIL) 10 MG tablet Take 10 mg by mouth daily.   Yes [provider]  ondansetron (ZOFRAN ODT) 4 MG disintegrating tablet Take 1 tablet (4 mg total) by mouth every 8 (eight) hours as needed for nausea or vomiting. Patient not taking: Reported on 02/17/2017 07/09/16   Ward, Ozella Almond, PA-C  oxyCODONE-acetaminophen (PERCOCET/ROXICET) 5-325 MG tablet Take 2 tablets by mouth every 8 (eight) hours as needed for severe pain. Patient not taking: Reported on 02/17/2017 07/09/16   Ward, Ozella Almond, PA-C    Family History No family history on file.  Social  History Social History  Substance Use Topics  . Smoking status: Never Smoker  . Smokeless tobacco: Never Used  . Alcohol use No     Allergies   Patient has no known allergies.   Review of Systems Review of Systems  Constitutional: Negative for fever.  Gastrointestinal: Positive for abdominal pain and nausea. Negative for vomiting.  All other systems reviewed and are negative.    Physical Exam Updated Vital Signs BP (!) 156/96 (BP Location: Right Arm)   Pulse 88   Temp 98.5 F (36.9 C) (Oral)   Resp 16   SpO2 99%   Physical Exam  Constitutional: He is oriented to person, place, and time. He appears well-developed and well-nourished. No distress.  HENT:  Head: Normocephalic and atraumatic.  Right Ear: Hearing normal.  Left Ear: Hearing normal.  Nose: Nose normal.  Mouth/Throat: Oropharynx is clear and moist and mucous membranes are normal.  Eyes: Conjunctivae and EOM are normal. Pupils are equal, round, and reactive to light.  Neck: Normal range of motion. Neck supple.  Cardiovascular: Regular rhythm, S1 normal and S2 normal.  Exam reveals no gallop and no friction rub.   No murmur heard. Pulmonary/Chest: Effort normal and breath sounds normal. No respiratory distress. He exhibits no tenderness.  Abdominal: Soft. Normal appearance and bowel sounds are normal. There is no hepatosplenomegaly. There is tenderness (diffuse ). There is no rebound, no guarding and negative Murphy's sign. No hernia.  Examination does reveal diffuse tenderness. Does have some increased focal tenderness  in the right lower quadrant, no guarding or rebound  Musculoskeletal: Normal range of motion.  Neurological: He is alert and oriented to person, place, and time. He has normal strength. No cranial nerve deficit or sensory deficit. Coordination normal. GCS eye subscore is 4. GCS verbal subscore is 5. GCS motor subscore is 6.  Skin: Skin is warm, dry and intact. No rash noted. No cyanosis.   Psychiatric: He has a normal mood and affect. His speech is normal and behavior is normal. Thought content normal.  Nursing note and vitals reviewed.    ED Treatments / Results  DIAGNOSTIC STUDIES:  Oxygen Saturation is 100% on RA, normal by my interpretation.    COORDINATION OF CARE:  3:55 AM Discussed treatment plan with pt at bedside and pt agreed to plan.  Labs (all labs ordered are listed, but only abnormal results are displayed) Labs Reviewed  COMPREHENSIVE METABOLIC PANEL - Abnormal; Notable for the following:       Result Value   Glucose, Bld 118 (*)    Creatinine, Ser 1.73 (*)    Total Protein 8.4 (*)    AST 111 (*)    ALT 226 (*)    Alkaline Phosphatase 36 (*)    GFR calc non Af Amer 44 (*)    GFR calc Af Amer 51 (*)    All other components within normal limits  CBC - Abnormal; Notable for the following:    Hemoglobin 12.2 (*)    HCT 37.8 (*)    All other components within normal limits  LIPASE, BLOOD  URINALYSIS, ROUTINE W REFLEX MICROSCOPIC    EKG  EKG Interpretation None       Radiology Ct Abdomen Pelvis W Contrast  Result Date: 02/17/2017 CLINICAL DATA:  Acute onset of severe generalized abdominal pain and nausea. Initial encounter. EXAM: CT ABDOMEN AND PELVIS WITH CONTRAST TECHNIQUE: Multidetector CT imaging of the abdomen and pelvis was performed using the standard protocol following bolus administration of intravenous contrast. CONTRAST:  49mL ISOVUE-300 IOPAMIDOL (ISOVUE-300) INJECTION 61% COMPARISON:  Right upper quadrant ultrasound performed 07/09/2016 FINDINGS: Lower chest: The visualized lung bases are grossly clear. The visualized portions of the mediastinum are unremarkable. Hepatobiliary: The liver is unremarkable in appearance. The gallbladder is unremarkable in appearance. The common bile duct remains normal in caliber. Pancreas: The pancreas is within normal limits. Prominent periportal and peripancreatic nodes are seen, measuring up to 1.8  cm in short axis. Spleen: The spleen is unremarkable in appearance. Adrenals/Urinary Tract: The adrenal glands are unremarkable in appearance. The kidneys are within normal limits. There is no evidence of hydronephrosis. No renal or ureteral stones are identified. Nonspecific perinephric stranding is noted bilaterally. Stomach/Bowel: There is dilatation of the appendix to 1.3 cm in maximal diameter, with surrounding soft tissue inflammation and associated wall thickening. There is no evidence of perforation or abscess formation at this time. Findings are compatible with acute appendicitis. Mild scattered diverticulosis is noted along the descending and proximal sigmoid colon, without evidence of diverticulitis. The small bowel is unremarkable. The stomach is unremarkable in appearance. Vascular/Lymphatic: Scattered calcification is seen along the abdominal aorta and its branches. The abdominal aorta is otherwise grossly unremarkable. The inferior vena cava is grossly unremarkable. No retroperitoneal lymphadenopathy is seen. No pelvic sidewall lymphadenopathy is identified. Reproductive: The bladder is mildly distended and grossly remarkable. The prostate is borderline normal in size. Other: No additional soft tissue abnormalities are seen. Musculoskeletal: No acute osseous abnormalities are identified. The visualized musculature is unremarkable  in appearance. IMPRESSION: 1. Acute appendicitis, with dilatation of the appendix to 1.3 cm in maximal diameter, surrounding soft tissue inflammation and associated wall thickening. No evidence of perforation or abscess formation at this time. 2. Prominent periportal and peripancreatic nodes measure up to 1.8 cm in short axis, of uncertain significance. 3. Mild scattered diverticulosis along the descending and proximal sigmoid colon, without evidence of diverticulitis. 4. Scattered aortic atherosclerosis. These results were called by telephone at the time of interpretation on  02/17/2017 at 5:08 am to Dr. Joseph Berkshire, who verbally acknowledged these results. Electronically Signed   By: Garald Balding M.D.   On: 02/17/2017 05:09    Procedures Procedures (including critical care time)  Medications Ordered in ED Medications  cefTRIAXone (ROCEPHIN) 2 g in dextrose 5 % 50 mL IVPB (not administered)    And  metroNIDAZOLE (FLAGYL) IVPB 500 mg (not administered)  sodium chloride 0.9 % bolus 1,000 mL (1,000 mLs Intravenous New Bag/Given 02/17/17 0425)  ondansetron (ZOFRAN) injection 4 mg (4 mg Intravenous Given 02/17/17 0424)  HYDROmorphone (DILAUDID) injection 1 mg (1 mg Intravenous Given 02/17/17 0424)  iopamidol (ISOVUE-300) 61 % injection (80 mLs  Contrast Given 02/17/17 0443)     Initial Impression / Assessment and Plan / ED Course  I have reviewed the triage vital signs and the nursing notes.  Pertinent labs & imaging results that were available during my care of the patient were reviewed by me and considered in my medical decision making (see chart for details).     Patient presents to the emergency department for evaluation of abdominal pain. Patient reports that he had a similar episode of abdominal pain last year, was seen in the ER. Reviewing the records reveals that at that time he was having predominantly right upper quadrant tenderness, had some mildly abnormal LFTs. Ultrasound was normal. Examination today did reveal, once again, diffuse abdominal tenderness, but he did have some focal tenderness in the right lower quadrant. CAT scan was therefore performed and did confirm evidence of acute appendicitis. Patient initiated on antibiotic coverage, discussed with Dr. Hulen Skains. General surgery will see the patient for definitive care.  Final Clinical Impressions(s) / ED Diagnoses   Final diagnoses:  Acute appendicitis, unspecified acute appendicitis type    New Prescriptions New Prescriptions   No medications on file   I personally performed the services  described in this documentation, which was scribed in my presence. The recorded information has been reviewed and is accurate.     Orpah Greek, MD 02/17/17 [phone]

## 2017-02-17 NOTE — Transfer of Care (Signed)
Immediate Anesthesia Transfer of Care Note  Patient: Benjamin Castillo  Procedure(s) Performed: Procedure(s): APPENDECTOMY LAPAROSCOPIC (N/A)  Patient Location: PACU  Anesthesia Type:General  Level of Consciousness: alert , oriented, drowsy and patient cooperative  Airway & Oxygen Therapy: Patient Spontanous Breathing and Patient connected to face mask oxygen  Post-op Assessment: Report given to RN, Post -op Vital signs reviewed and stable and Patient moving all extremities X 4  Post vital signs: Reviewed and stable  Last Vitals:  Vitals:   02/17/17 1116 02/17/17 1118  BP:  (!) 149/84  Pulse: 89 84  Resp: 13 12  Temp: 36.6 C     Last Pain:  Vitals:   02/17/17 0726  TempSrc:   PainSc: 7          Complications: No apparent anesthesia complications

## 2017-02-17 NOTE — H&P (Signed)
Benjamin Castillo is an 51 y.o. male.   Chief Complaint: Abdominal pain HPI: Patient was well until about 1800 yesterday when, while still at work, he developed abdominal pain on the right side associated with nausea but no vomiting.  The persisted in spite of use of Pepto-Bismol and Advil.  Came to the ED about 2300 yesterday evening.  CT done which demonstrated feature indicative of acute appendicitis.  I was called at approximately 0530 this AM  Past Medical History:  Diagnosis Date  . Hypertension   . Psoriasis     History reviewed. No pertinent surgical history.  No family history on file. Social History:  reports that he has never smoked. He has never used smokeless tobacco. He reports that he does not drink alcohol or use drugs.  Allergies: No Known Allergies   (Not in a hospital admission)  Results for orders placed or performed during the hospital encounter of 02/17/17 (from the past 48 hour(s))  Lipase, blood     Status: None   Collection Time: 02/17/17 12:14 AM  Result Value Ref Range   Lipase 37 11 - 51 U/L  Comprehensive metabolic panel     Status: Abnormal   Collection Time: 02/17/17 12:14 AM  Result Value Ref Range   Sodium 137 135 - 145 mmol/L   Potassium 3.6 3.5 - 5.1 mmol/L   Chloride 102 101 - 111 mmol/L   CO2 29 22 - 32 mmol/L   Glucose, Bld 118 (H) 65 - 99 mg/dL   BUN 19 6 - 20 mg/dL   Creatinine, Ser 1.73 (H) 0.61 - 1.24 mg/dL   Calcium 9.6 8.9 - 10.3 mg/dL   Total Protein 8.4 (H) 6.5 - 8.1 g/dL   Albumin 4.2 3.5 - 5.0 g/dL   AST 111 (H) 15 - 41 U/L   ALT 226 (H) 17 - 63 U/L   Alkaline Phosphatase 36 (L) 38 - 126 U/L   Total Bilirubin 0.6 0.3 - 1.2 mg/dL   GFR calc non Af Amer 44 (L) >60 mL/min   GFR calc Af Amer 51 (L) >60 mL/min    Comment: (NOTE) The eGFR has been calculated using the CKD EPI equation. This calculation has not been validated in all clinical situations. eGFR's persistently <60 mL/min signify possible Chronic Kidney Disease.     Anion gap 6 5 - 15  CBC     Status: Abnormal   Collection Time: 02/17/17 12:14 AM  Result Value Ref Range   WBC 9.4 4.0 - 10.5 K/uL   RBC 4.48 4.22 - 5.81 MIL/uL   Hemoglobin 12.2 (L) 13.0 - 17.0 g/dL   HCT 37.8 (L) 39.0 - 52.0 %   MCV 84.4 78.0 - 100.0 fL   MCH 27.2 26.0 - 34.0 pg   MCHC 32.3 30.0 - 36.0 g/dL   RDW 14.4 11.5 - 15.5 %   Platelets 228 150 - 400 K/uL  Urinalysis, Routine w reflex microscopic     Status: Abnormal   Collection Time: 02/17/17  6:01 AM  Result Value Ref Range   Color, Urine STRAW (A) YELLOW   APPearance CLEAR CLEAR   Specific Gravity, Urine 1.026 1.005 - 1.030   pH 5.0 5.0 - 8.0   Glucose, UA NEGATIVE NEGATIVE mg/dL   Hgb urine dipstick NEGATIVE NEGATIVE   Bilirubin Urine NEGATIVE NEGATIVE   Ketones, ur NEGATIVE NEGATIVE mg/dL   Protein, ur NEGATIVE NEGATIVE mg/dL   Nitrite NEGATIVE NEGATIVE   Leukocytes, UA NEGATIVE NEGATIVE   Ct Abdomen  Pelvis W Contrast  Result Date: 02/17/2017 CLINICAL DATA:  Acute onset of severe generalized abdominal pain and nausea. Initial encounter. EXAM: CT ABDOMEN AND PELVIS WITH CONTRAST TECHNIQUE: Multidetector CT imaging of the abdomen and pelvis was performed using the standard protocol following bolus administration of intravenous contrast. CONTRAST:  110m ISOVUE-300 IOPAMIDOL (ISOVUE-300) INJECTION 61% COMPARISON:  Right upper quadrant ultrasound performed 07/09/2016 FINDINGS: Lower chest: The visualized lung bases are grossly clear. The visualized portions of the mediastinum are unremarkable. Hepatobiliary: The liver is unremarkable in appearance. The gallbladder is unremarkable in appearance. The common bile duct remains normal in caliber. Pancreas: The pancreas is within normal limits. Prominent periportal and peripancreatic nodes are seen, measuring up to 1.8 cm in short axis. Spleen: The spleen is unremarkable in appearance. Adrenals/Urinary Tract: The adrenal glands are unremarkable in appearance. The kidneys are within  normal limits. There is no evidence of hydronephrosis. No renal or ureteral stones are identified. Nonspecific perinephric stranding is noted bilaterally. Stomach/Bowel: There is dilatation of the appendix to 1.3 cm in maximal diameter, with surrounding soft tissue inflammation and associated wall thickening. There is no evidence of perforation or abscess formation at this time. Findings are compatible with acute appendicitis. Mild scattered diverticulosis is noted along the descending and proximal sigmoid colon, without evidence of diverticulitis. The small bowel is unremarkable. The stomach is unremarkable in appearance. Vascular/Lymphatic: Scattered calcification is seen along the abdominal aorta and its branches. The abdominal aorta is otherwise grossly unremarkable. The inferior vena cava is grossly unremarkable. No retroperitoneal lymphadenopathy is seen. No pelvic sidewall lymphadenopathy is identified. Reproductive: The bladder is mildly distended and grossly remarkable. The prostate is borderline normal in size. Other: No additional soft tissue abnormalities are seen. Musculoskeletal: No acute osseous abnormalities are identified. The visualized musculature is unremarkable in appearance. IMPRESSION: 1. Acute appendicitis, with dilatation of the appendix to 1.3 cm in maximal diameter, surrounding soft tissue inflammation and associated wall thickening. No evidence of perforation or abscess formation at this time. 2. Prominent periportal and peripancreatic nodes measure up to 1.8 cm in short axis, of uncertain significance. 3. Mild scattered diverticulosis along the descending and proximal sigmoid colon, without evidence of diverticulitis. 4. Scattered aortic atherosclerosis. These results were called by telephone at the time of interpretation on 02/17/2017 at 5:08 am to Dr. CJoseph Berkshire who verbally acknowledged these results. Electronically Signed   By: JGarald BaldingM.D.   On: 02/17/2017 05:09     Review of Systems  Constitutional: Negative for chills and fever.  All other systems reviewed and are negative.   Blood pressure (!) 147/98, pulse 81, temperature 98.5 F (36.9 C), temperature source Oral, resp. rate 16, SpO2 100 %. Physical Exam  Vitals reviewed. Constitutional: He is oriented to person, place, and time. He appears well-developed and well-nourished.  HENT:  Head: Normocephalic and atraumatic.  Right Ear: External ear normal.  Left Ear: External ear normal.  Eyes: Conjunctivae and EOM are normal. Pupils are equal, round, and reactive to light.  Cardiovascular: Normal rate, regular rhythm, normal heart sounds and intact distal pulses.   Respiratory: Effort normal and breath sounds normal.  GI: Soft. Bowel sounds are normal. There is tenderness in the right lower quadrant. There is rebound and tenderness at McBurney's point. There is no rigidity and no guarding.  Musculoskeletal: Normal range of motion.  Neurological: He is alert and oriented to person, place, and time. He has normal reflexes.  Skin: Skin is warm and dry.  Psychiatric: He has  a normal mood and affect. His behavior is normal. Judgment and thought content normal.     Assessment/Plan What appears to be acute appendicitis without complications.  No current evidence of rupture.  Will admit and give IV antibiotics and subsequently take to the OR with the surgical team for laparoscopic appendectomy  Judeth Horn, MD 02/17/2017, 6:45 AM

## 2017-02-17 NOTE — Anesthesia Preprocedure Evaluation (Addendum)
Anesthesia Evaluation  Patient identified by MRN, date of birth, ID band Patient awake    Reviewed: Allergy & Precautions, H&P , NPO status , Patient's Chart, lab work & pertinent test results  Airway Mallampati: II  TM Distance: >3 FB Neck ROM: full    Dental  (+) Teeth Intact, Dental Advidsory Given   Pulmonary neg pulmonary ROS,    breath sounds clear to auscultation       Cardiovascular hypertension, On Medications  Rhythm:regular Rate:Normal     Neuro/Psych    GI/Hepatic   Endo/Other    Renal/GU Renal InsufficiencyRenal disease     Musculoskeletal   Abdominal   Peds  Hematology   Anesthesia Other Findings   Reproductive/Obstetrics                            Anesthesia Physical Anesthesia Plan  ASA: II  Anesthesia Plan: General   Post-op Pain Management:    Induction:   PONV Risk Score and Plan: 2 and 3 and Ondansetron, Dexamethasone, Propofol, Midazolam and Treatment may vary due to age or medical condition  Airway Management Planned: Oral ETT  Additional Equipment:   Intra-op Plan:   Post-operative Plan: Extubation in OR  Informed Consent: I have reviewed the patients History and Physical, chart, labs and discussed the procedure including the risks, benefits and alternatives for the proposed anesthesia with the patient or authorized representative who has indicated his/her understanding and acceptance.   Dental Advisory Given  Plan Discussed with: CRNA, Anesthesiologist and Surgeon  Anesthesia Plan Comments:        Anesthesia Quick Evaluation

## 2017-02-17 NOTE — Interval H&P Note (Signed)
History and Physical Interval Note:  02/17/2017 9:26 AM  Benjamin Castillo  has presented today for surgery, with the diagnosis of Acute appendicitis  The various methods of treatment have been discussed with the patient and family. After consideration of risks, benefits and other options for treatment, the patient has consented to  Procedure(s): APPENDECTOMY LAPAROSCOPIC (N/A) as a surgical intervention .  The patient's history has been reviewed, patient examined, no change in status, stable for surgery.  I have reviewed the patient's chart and labs.  Questions were answered to the patient's satisfaction.     Shizuo Biskup A.

## 2017-02-18 ENCOUNTER — Encounter (HOSPITAL_COMMUNITY): Payer: Self-pay | Admitting: Surgery

## 2017-02-18 LAB — BASIC METABOLIC PANEL
Anion gap: 5 (ref 5–15)
BUN: 14 mg/dL (ref 6–20)
CHLORIDE: 108 mmol/L (ref 101–111)
CO2: 25 mmol/L (ref 22–32)
CREATININE: 1.43 mg/dL — AB (ref 0.61–1.24)
Calcium: 8.1 mg/dL — ABNORMAL LOW (ref 8.9–10.3)
GFR calc Af Amer: >60 mL/min (ref >60)
GFR calc non Af Amer: 56 mL/min — ABNORMAL LOW (ref >60)
GLUCOSE: 142 mg/dL — AB (ref 65–99)
Potassium: 4 mmol/L (ref 3.5–5.1)
Sodium: 138 mmol/L (ref 135–145)

## 2017-02-18 LAB — CBC
HEMATOCRIT: 32.8 % — AB (ref 39.0–52.0)
HEMOGLOBIN: 10.4 g/dL — AB (ref 13.0–17.0)
MCH: 27.1 pg (ref 26.0–34.0)
MCHC: 31.7 g/dL (ref 30.0–36.0)
MCV: 85.4 fL (ref 78.0–100.0)
Platelets: 176 10*3/uL (ref 150–400)
RBC: 3.84 MIL/uL — ABNORMAL LOW (ref 4.22–5.81)
RDW: 14.8 % (ref 11.5–15.5)
WBC: 11.3 10*3/uL — ABNORMAL HIGH (ref 4.0–10.5)

## 2017-02-18 MED ORDER — HYDROCODONE-ACETAMINOPHEN 5-325 MG PO TABS: 1.0000 | ORAL_TABLET | Freq: Four times a day (QID) | ORAL | 0 refills | Status: DC | PRN

## 2017-02-18 NOTE — Progress Notes (Signed)
Pt is tolerating regular diet, pain is controlled. Discharge instructions given to pt, verbalized understanding. Discharged to home accompanied by wife.

## 2017-02-18 NOTE — Discharge Summary (Signed)
  Patient ID: Benjamin Castillo [id number] 51 y.o. 1965/09/23  Admit date: 02/17/2017  Discharge date and time: 02/18/2017  Admitting Physician: Judeth Horn  Discharge Physician: Adin Hector  Admission Diagnoses: Abdominal pain [R10.9] Acute appendicitis, unspecified acute appendicitis type [K35.80]  Discharge Diagnoses: Acute appendicitis  Operations: Procedure(s): APPENDECTOMY LAPAROSCOPIC  Admission Condition: fair  Discharged Condition: good  Indication for Admission: This is a 51 year old man who was well.  At work he developed right-sided abdominal pain and nausea.  He tried Pepto-Bismol and Advil but the pain persisted.  He came to the emergency room at 11 PM.  CT scan demonstrated acute appendicitis and surgery was called  Hospital Course: The patient was admitted by surgery.  Started on antibiotics and taken to the operating room.  Dr. Brantley Stage performed a laparoscopic appendectomy.  There was no abscess gangrene or perforation.  The patient did well overnight and was ready to go home the following morning.  There were tolerating regular diet, voiding without difficulty and ambulating.  Exam revealed abdomen was soft.  Minimal incisional tenderness.  Wounds looked good.    He was given instruction in diet and activities.  No sports or strenuous work for 3 weeks.  Patient is to call the office and set up an appointment to see [place] in the clinic.  He was given a prescription for Norco.  Continue usual medications  Consults: None  Significant Diagnostic Studies: Surgical pathology, pending  Treatments: surgery: Laparoscopic appendectomy  Disposition: Home  Patient Instructions:  Allergies as of 02/18/2017   No Known Allergies     Medication List    TAKE these medications   HYDROcodone-acetaminophen 5-325 MG tablet Commonly known as:  NORCO Take 1-2 tablets by mouth every 6 (six) hours as needed for moderate pain or severe pain.   lisinopril 10 MG tablet Commonly known  as:  PRINIVIL,ZESTRIL Take 10 mg by mouth daily.   ondansetron 4 MG disintegrating tablet Commonly known as:  ZOFRAN ODT Take 1 tablet (4 mg total) by mouth every 8 (eight) hours as needed for nausea or vomiting.   oxyCODONE-acetaminophen 5-325 MG tablet Commonly known as:  PERCOCET/ROXICET Take 2 tablets by mouth every 8 (eight) hours as needed for severe pain.       Activity: Ambulate frequently.  Okay to drive.  Okay to shower.  No sports or heavy lifting for 3 weeks Diet: low fat, low cholesterol diet Wound Care: none needed  Follow-up:  With [place] clinic in 2 weeks.  Signed: Edsel Petrin. Dalbert Batman, M.D., FACS General and minimally invasive surgery Breast and Colorectal Surgery   Addendum: I have log onto the [facility] website and reviewed his prescription medication history  02/18/2017, 10:09 AM

## 2017-02-19 NOTE — Anesthesia Postprocedure Evaluation (Signed)
Anesthesia Post Note  Patient: Benjamin Castillo  Procedure(s) Performed: Procedure(s) (LRB): APPENDECTOMY LAPAROSCOPIC (N/A)     Patient location during evaluation: PACU Anesthesia Type: General Level of consciousness: awake and alert and patient cooperative Pain management: pain level controlled Vital Signs Assessment: post-procedure vital signs reviewed and stable Respiratory status: spontaneous breathing and respiratory function stable Cardiovascular status: stable Anesthetic complications: no    Last Vitals:  Vitals:   02/18/17 0536 02/18/17 1003  BP: (!) 120/49 122/65  Pulse: 64 79  Resp: 17 18  Temp: 36.9 C 36.5 C    Last Pain:  Vitals:   02/18/17 1003  TempSrc: Oral  PainSc:                  [place] S

## 2017-04-29 ENCOUNTER — Encounter: Payer: Self-pay | Admitting: Podiatry

## 2017-04-29 ENCOUNTER — Ambulatory Visit (INDEPENDENT_AMBULATORY_CARE_PROVIDER_SITE_OTHER): Payer: Managed Care, Other (non HMO) | Admitting: Podiatry

## 2017-04-29 DIAGNOSIS — B353 Tinea pedis: Secondary | ICD-10-CM

## 2017-04-29 DIAGNOSIS — L409 Psoriasis, unspecified: Secondary | ICD-10-CM

## 2017-04-29 DIAGNOSIS — B351 Tinea unguium: Secondary | ICD-10-CM | POA: Diagnosis not present

## 2017-04-29 MED ORDER — LULICONAZOLE 1 % EX CREA: 0.5000 g | TOPICAL_CREAM | Freq: Every day | CUTANEOUS | 1 refills | Status: DC

## 2017-04-29 NOTE — Progress Notes (Signed)
   Subjective:    Patient ID: Benjamin Castillo, male    DOB: [birth year], 51 y.o.   MRN: [id number] Chief Complaint  Patient presents with  . Nail Problem    i have some toenails that are dark and brittle  . Foot Problem    i have some itching and i have tried over the counter stuff with no help     HPI 51 y.o. male presents with the above complaint.  Reports hx of Psoriasis for ~20 years. Now well controlled. Not well controlled when he was diagnosed. Currently takes Tremfya for his psoriasis and is well controlled.   Past Medical History:  Diagnosis Date  . Hypertension   . Psoriasis    Past Surgical History:  Procedure Laterality Date  . LAPAROSCOPIC APPENDECTOMY N/A 02/17/2017   Procedure: APPENDECTOMY LAPAROSCOPIC;  Surgeon: Erroll Luna, MD;  Location: [place];  Service: General;  Laterality: N/A;    Current Outpatient Prescriptions:  .  HYDROcodone-acetaminophen (NORCO) 5-325 MG tablet, Take 1-2 tablets by mouth every 6 (six) hours as needed for moderate pain or severe pain. (Patient not taking: Reported on 04/29/2017), Disp: 30 tablet, Rfl: 0 .  lisinopril (PRINIVIL,ZESTRIL) 10 MG tablet, Take 10 mg by mouth daily., Disp: , Rfl:  .  Luliconazole 1 % CREA, Apply 0.5 g topically daily. To affected toenails and bottom of feet, including in between toes., Disp: 60 g, Rfl: 1 .  ondansetron (ZOFRAN ODT) 4 MG disintegrating tablet, Take 1 tablet (4 mg total) by mouth every 8 (eight) hours as needed for nausea or vomiting. (Patient not taking: Reported on 02/17/2017), Disp: 12 tablet, Rfl: 0 .  oxyCODONE-acetaminophen (PERCOCET/ROXICET) 5-325 MG tablet, Take 2 tablets by mouth every 8 (eight) hours as needed for severe pain. (Patient not taking: Reported on 02/17/2017), Disp: 5 tablet, Rfl: 0 .  TREMFYA 100 MG/ML SOSY, INJECT 100MG  UNDER THE SKIN AT WEEK 0, WEEK 4, AND EVERY 8 WEEKS THEREAFTER, Disp: , Rfl: 5  No Known Allergies   Review of Systems     Objective:   Physical Exam There  were no vitals filed for this visit. General AA&O x3. Normal mood and affect.  Vascular Dorsalis pedis and posterior tibial pulses  present 2+ bilaterally  Capillary refill normal to all digits. Pedal hair growth normal.  Neurologic Epicritic sensation grossly present.  Dermatologic Pruritic xerosis with scaling to plantar foot bilat and 1st-3rd interspaces bilat. Nails well groomed, 3rd toenails bilat with thickening and crumbly texture, transverse ridging. No evidence of psoriatic pitting.   Orthopedic: MMT 5/5 in dorsiflexion, plantarflexion, inversion, and eversion. Normal joint ROM without pain or crepitus.      Assessment & Plan:  Recalcitrant Tinea, Onychomycosis -Educated on etiology of nail fungus. -Patient has failed other topical medications for this condition. -Rx Luliconazole due to better efficacy for interdigital component of his tinea. -Patient not ideal candidate for oral medications, as he takes immunosuppressant for psoriasis.

## 2017-05-27 ENCOUNTER — Ambulatory Visit (INDEPENDENT_AMBULATORY_CARE_PROVIDER_SITE_OTHER): Payer: Managed Care, Other (non HMO) | Admitting: Podiatry

## 2017-05-27 ENCOUNTER — Encounter: Payer: Self-pay | Admitting: Podiatry

## 2017-05-27 DIAGNOSIS — B353 Tinea pedis: Secondary | ICD-10-CM | POA: Diagnosis not present

## 2017-05-27 DIAGNOSIS — B351 Tinea unguium: Secondary | ICD-10-CM | POA: Diagnosis not present

## 2017-05-27 MED ORDER — LULICONAZOLE 1 % EX CREA: 0.5000 g | TOPICAL_CREAM | Freq: Every day | CUTANEOUS | 1 refills | Status: DC

## 2017-05-27 NOTE — Progress Notes (Signed)
  Subjective:  Patient ID: Benjamin Castillo, male    DOB: August 14, 1966,  MRN: [id number]  Chief Complaint  Patient presents with  . Foot Problem    my feet are doing better and they are not itching like they were   51 y.o. male returns for the above complaint. States that his feet are not as itchy. He has been using the antifungal cream as directed  Objective:   General AA&O x3. Normal mood and affect.  Vascular Dorsalis pedis and posterior tibial pulses  present 2+ bilaterally  Capillary refill normal to all digits. Pedal hair growth normal.  Neurologic Epicritic sensation grossly present.  Dermatologic Xerosis with scaling bilaterally. Fourth interspace maceration bilateral Nails well groomed, 3rd toenails bilat with thickening and crumbly texture, transverse ridging. No evidence of psoriatic pitting.   Orthopedic: MMT 5/5 in dorsiflexion, plantarflexion, inversion, and eversion. Normal joint ROM without pain or crepitus.   Assessment & Plan:  Patient was evaluated and treated and all questions answered.  Tinea pedis bilateral; improving -Continual Luzu cream. Prescription refilled -Discussed hygiene including disinfection of shoes -Follow-up as needed

## 2017-12-04 ENCOUNTER — Other Ambulatory Visit: Payer: Self-pay

## 2017-12-04 ENCOUNTER — Encounter (HOSPITAL_COMMUNITY): Payer: Self-pay | Admitting: Emergency Medicine

## 2017-12-04 ENCOUNTER — Emergency Department (HOSPITAL_COMMUNITY)
Admission: EM | Admit: 2017-12-04 | Discharge: 2017-12-04 | Disposition: A | Discharge status: Home / Self Care | Payer: Managed Care, Other (non HMO) | Attending: Emergency Medicine | Admitting: Emergency Medicine

## 2017-12-04 DIAGNOSIS — Y939 Activity, unspecified: Secondary | ICD-10-CM | POA: Diagnosis not present

## 2017-12-04 DIAGNOSIS — Y999 Unspecified external cause status: Secondary | ICD-10-CM | POA: Insufficient documentation

## 2017-12-04 DIAGNOSIS — S3992XA Unspecified injury of lower back, initial encounter: Secondary | ICD-10-CM | POA: Diagnosis present

## 2017-12-04 DIAGNOSIS — S39012A Strain of muscle, fascia and tendon of lower back, initial encounter: Secondary | ICD-10-CM | POA: Insufficient documentation

## 2017-12-04 DIAGNOSIS — X509XXA Other and unspecified overexertion or strenuous movements or postures, initial encounter: Secondary | ICD-10-CM | POA: Diagnosis not present

## 2017-12-04 DIAGNOSIS — Y929 Unspecified place or not applicable: Secondary | ICD-10-CM | POA: Insufficient documentation

## 2017-12-04 DIAGNOSIS — Z79899 Other long term (current) drug therapy: Secondary | ICD-10-CM | POA: Insufficient documentation

## 2017-12-04 DIAGNOSIS — I10 Essential (primary) hypertension: Secondary | ICD-10-CM | POA: Insufficient documentation

## 2017-12-04 MED ORDER — CYCLOBENZAPRINE HCL 10 MG PO TABS: 10.0000 mg | ORAL_TABLET | Freq: Two times a day (BID) | ORAL | 0 refills | Status: DC | PRN

## 2017-12-04 MED ORDER — IBUPROFEN 800 MG PO TABS
800.0000 mg | ORAL_TABLET | Freq: Once | ORAL | Status: AC
  Administered 2017-12-04: 800 mg via ORAL
  Filled 2017-12-04: qty 1

## 2017-12-04 MED ORDER — IBUPROFEN 600 MG PO TABS: 600.0000 mg | ORAL_TABLET | Freq: Four times a day (QID) | ORAL | 0 refills | Status: DC | PRN

## 2017-12-04 NOTE — ED Provider Notes (Signed)
[facility] EMERGENCY DEPARTMENT Provider Note   CSN: [id number] Arrival date & time: 12/04/17  2109     History   Chief Complaint Chief Complaint  Patient presents with  . Back Pain    HPI Benjamin Castillo is a 52 y.o. male.  HPI   52 year old male with history of hypertension presenting for evaluation of back pain.  Patient developed acute onset of pain to his left lower back that started approximately 3 PM today.  Patient states he was not doing anything out of the ordinary when the pain came about.  He described pain as a sharp nonradiating pain, 8 out of 10, worse with laying down or with movement.  He has never had this kind of pain before.  Aside from some lifting the day before, he denies any specific injury or strenuous activities.  There is no associated fever, chills, lightheadedness, dizziness, chest pain, shortness of breath, productive cough, abdominal pain, dysuria, hematuria, bowel bladder incontinence or saddle anesthesia.  He denies any specific treatment tried.  Past Medical History:  Diagnosis Date  . Hypertension   . Psoriasis     Patient Active Problem List   Diagnosis Date Noted  . Acute appendicitis 02/17/2017    Past Surgical History:  Procedure Laterality Date  . LAPAROSCOPIC APPENDECTOMY N/A 02/17/2017   Procedure: APPENDECTOMY LAPAROSCOPIC;  Surgeon: Erroll Luna, MD;  Location: [place];  Service: General;  Laterality: N/A;        Home Medications    Prior to Admission medications   Medication Sig Start Date End Date Taking? Authorizing Provider  HYDROcodone-acetaminophen (NORCO) 5-325 MG tablet Take 1-2 tablets by mouth every 6 (six) hours as needed for moderate pain or severe pain. Patient not taking: Reported on 04/29/2017 02/18/17   Fanny Skates, MD  lisinopril (PRINIVIL,ZESTRIL) 10 MG tablet Take 10 mg by mouth daily.    [provider]  Luliconazole 1 % CREA Apply 0.5 g topically daily. To affected toenails and  bottom of feet, including in between toes. 05/27/17   Evelina Bucy, DPM  ondansetron (ZOFRAN ODT) 4 MG disintegrating tablet Take 1 tablet (4 mg total) by mouth every 8 (eight) hours as needed for nausea or vomiting. Patient not taking: Reported on 02/17/2017 07/09/16   Ward, Ozella Almond, PA-C  oxyCODONE-acetaminophen (PERCOCET/ROXICET) 5-325 MG tablet Take 2 tablets by mouth every 8 (eight) hours as needed for severe pain. Patient not taking: Reported on 02/17/2017 07/09/16   Ward, [place] Cerise Pilcher, PA-C  TREMFYA 100 MG/ML SOSY INJECT 100MG  UNDER THE SKIN AT WEEK 0, WEEK 4, AND EVERY 8 WEEKS THEREAFTER 03/25/17   [provider]    Family History No family history on file.  Social History Social History   Tobacco Use  . Smoking status: Never Smoker  . Smokeless tobacco: Never Used  Substance Use Topics  . Alcohol use: No  . Drug use: No     Allergies   Patient has no known allergies.   Review of Systems Review of Systems  All other systems reviewed and are negative.    Physical Exam Updated Vital Signs BP (!) 160/100 (BP Location: Right Arm)   Pulse 77   Temp 98 F (36.7 C) (Oral)   Resp 18   Ht 5\' 7"  (1.702 m)   Wt 98.9 kg (218 lb)   SpO2 99%   BMI 34.14 kg/m   Physical Exam  Constitutional: He appears well-developed and well-nourished. No distress.  HENT:  Head: Atraumatic.  Eyes: Conjunctivae are normal.  Neck: Neck supple.  Cardiovascular: Normal rate and regular rhythm.  Pulmonary/Chest: Effort normal and breath sounds normal.  Abdominal: Soft. Bowel sounds are normal. There is no tenderness.  Genitourinary:  Genitourinary Comments: No CVA tenderness  Musculoskeletal: He exhibits tenderness (Tenderness to left lumbar paraspinal muscle on palpation without any overlying skin changes or swelling.).  No significant midline spine tenderness crepitus or step-off.  Neurological: He is alert.  5 out of 5 strength bilateral lower extremities with intact  distal pedal pulses.  Ambulate without difficulty.  Skin: No rash noted.  Psychiatric: He has a normal mood and affect.  Nursing note and vitals reviewed.    ED Treatments / Results  Labs (all labs ordered are listed, but only abnormal results are displayed) Labs Reviewed - No data to display  EKG None  Radiology No results found.  Procedures Procedures (including critical care time)  Medications Ordered in ED Medications  ibuprofen (ADVIL,MOTRIN) tablet 800 mg (has no administration in time range)     Initial Impression / Assessment and Plan / ED Course  I have reviewed the triage vital signs and the nursing notes.  Pertinent labs & imaging results that were available during my care of the patient were reviewed by me and considered in my medical decision making (see chart for details).     BP (!) 160/100 (BP Location: Right Arm)   Pulse 77   Temp 98 F (36.7 C) (Oral)   Resp 18   Ht 5\' 7"  (1.702 m)   Wt 98.9 kg (218 lb)   SpO2 99%   BMI 34.14 kg/m    Final Clinical Impressions(s) / ED Diagnoses   Final diagnoses:  Strain of lumbar region, initial encounter    ED Discharge Orders        Ordered    ibuprofen (ADVIL,MOTRIN) 600 MG tablet  Every 6 hours PRN     12/04/17 2226    cyclobenzaprine (FLEXERIL) 10 MG tablet  2 times daily PRN     12/04/17 2226     10:22 PM Patient here with reproducible left lower back pain, likely MSK in origin.  He has a benign abdomen, no CVA tenderness, no skin changes and no radicular pain.  He is able to ambulate.  Will provide sxs treatment.  Return precaution given. Doubt kidney stone.   Domenic Moras, PA-C 12/04/17 2227    Orlie Dakin, MD 12/05/17 [phone]

## 2017-12-04 NOTE — ED Triage Notes (Signed)
Pt c/o lower central back pain since 1500. Pt denies any injury to the site. Never had this pain before. No neuro deficits. Pt ambulatory w/ steady gait. Pt has not taken anything for pain.

## 2017-12-04 NOTE — ED Notes (Signed)
Pt departed in NAD, refused use of wheelchair.  

## 2018-05-24 IMAGING — CT CT ABD-PELV W/ CM
2 of 5 series · 15 of 46 positions shown, 17 images · IV contrast (Omni 300)
Comparison: Right upper quadrant ultrasound performed 07/09/2016

CLINICAL DATA: Acute onset of severe generalized abdominal pain and
nausea. Initial encounter.

EXAM:
CT ABDOMEN AND PELVIS WITH CONTRAST
TECHNIQUE: Multidetector CT imaging of the abdomen and pelvis was performed
using the standard protocol following bolus administration of
intravenous contrast.
CONTRAST:  80mL X2DVHD-KRR IOPAMIDOL (X2DVHD-KRR) INJECTION 61%

[Series 3: a/p w/ 5mm · axial · 0.71mm/px · z∈[-472,-42]mm · 12 of 98 slices shown, 14 images]
[im 6/98  soft-tissue]
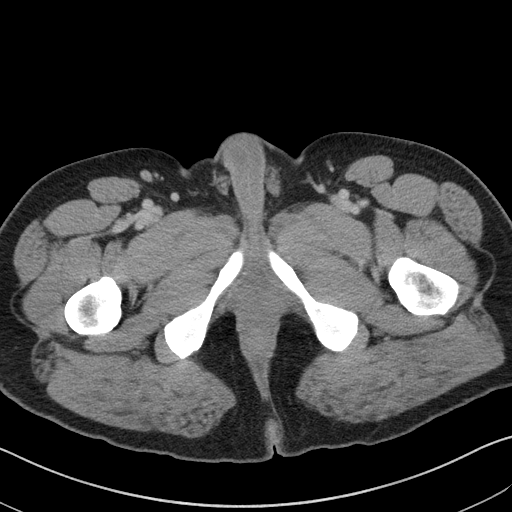
[im 6/98  bone]
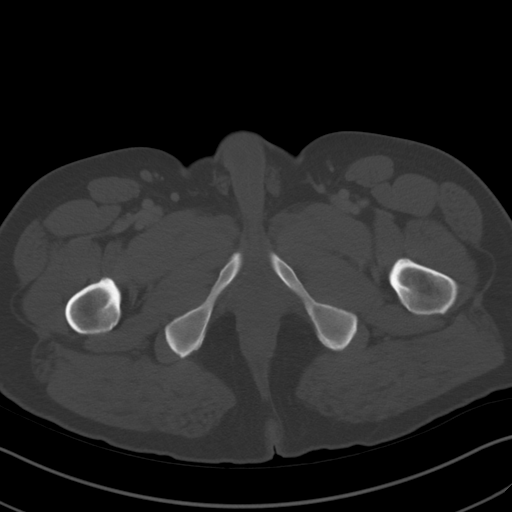
[im 16/98  soft-tissue]
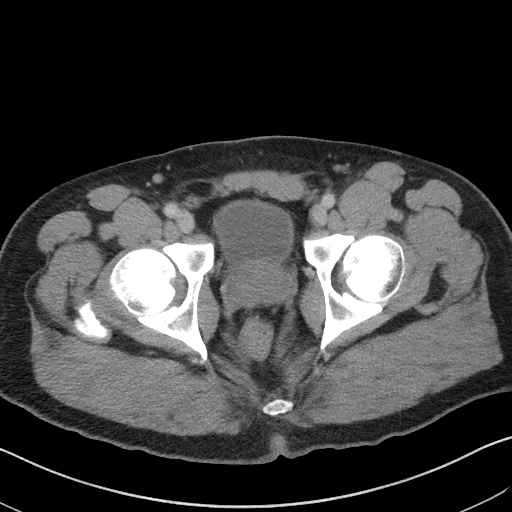
[im 21/98  soft-tissue]
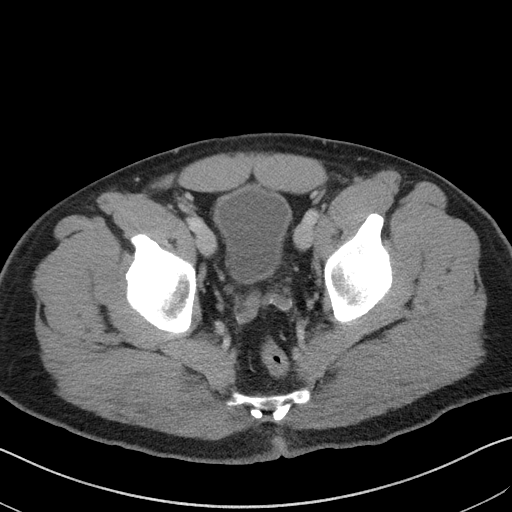
[im 31/98  soft-tissue]
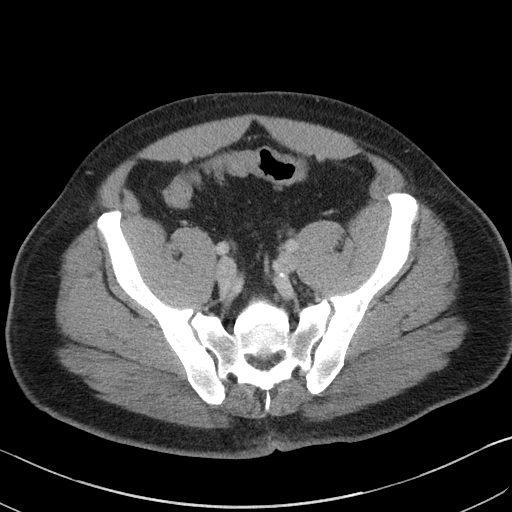
[im 36/98  soft-tissue]
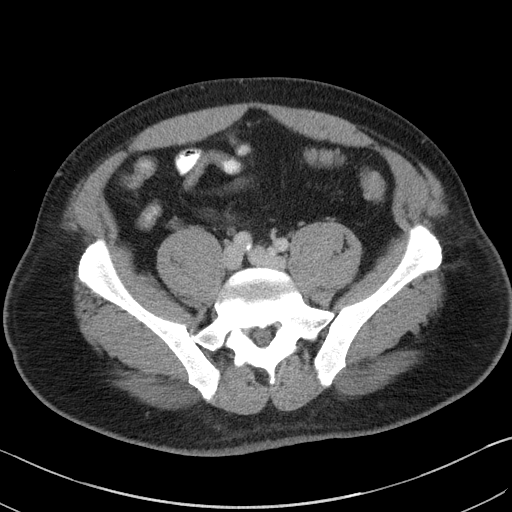
[im 46/98  soft-tissue]
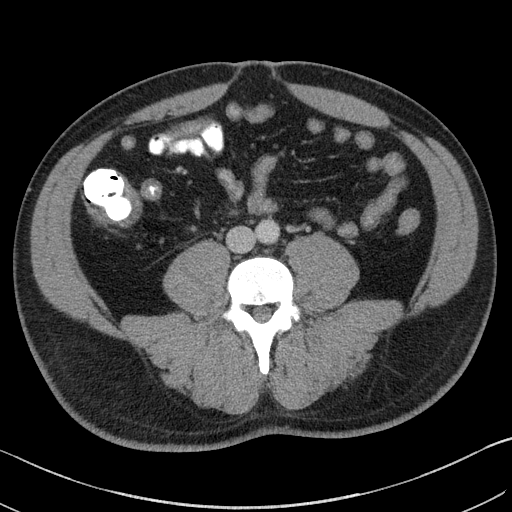
[im 52/98  soft-tissue]
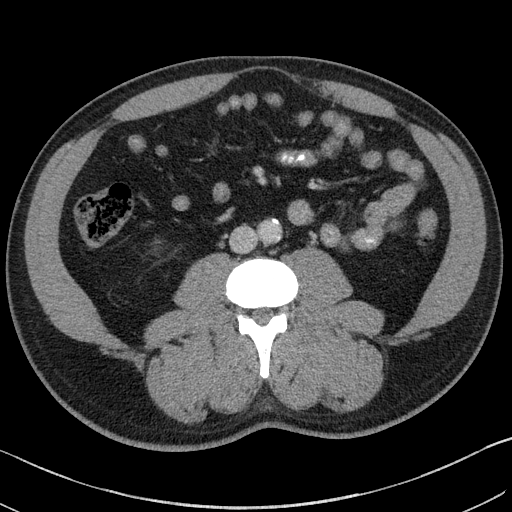
[im 62/98  soft-tissue]
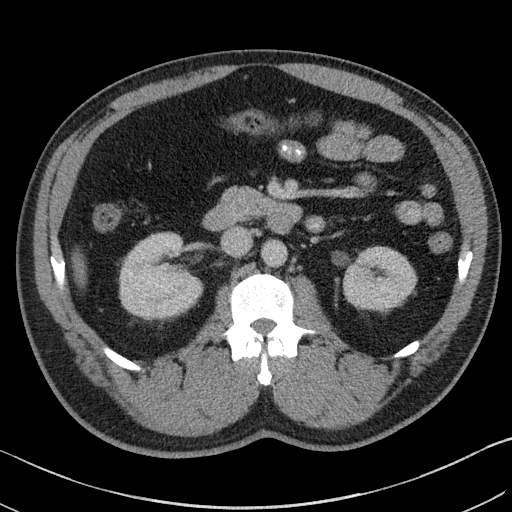
[im 67/98  soft-tissue]
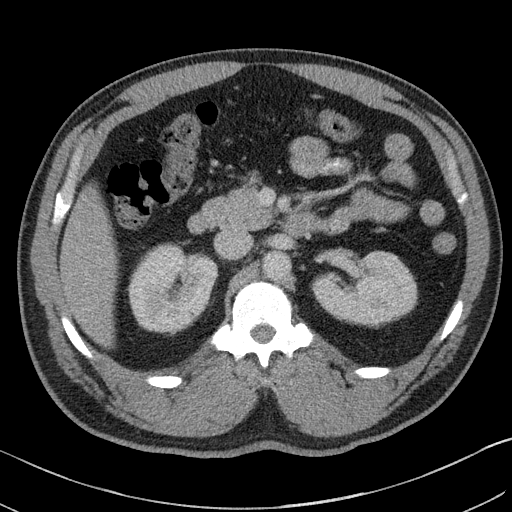
[im 67/98  bone]
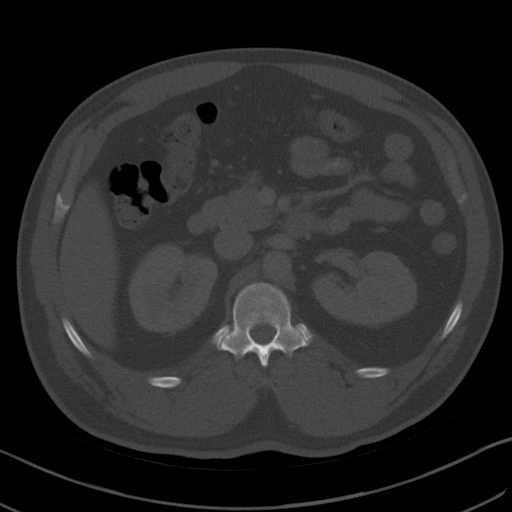
[im 77/98  soft-tissue]
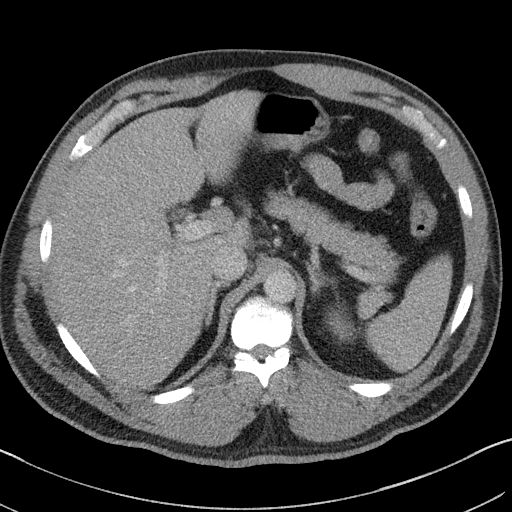
[im 82/98  soft-tissue]
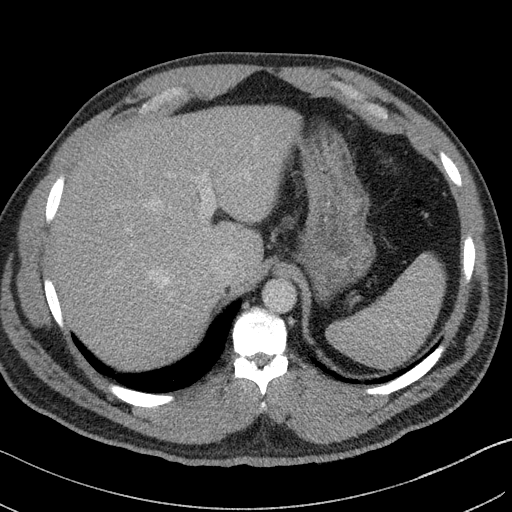
[im 92/98  soft-tissue]
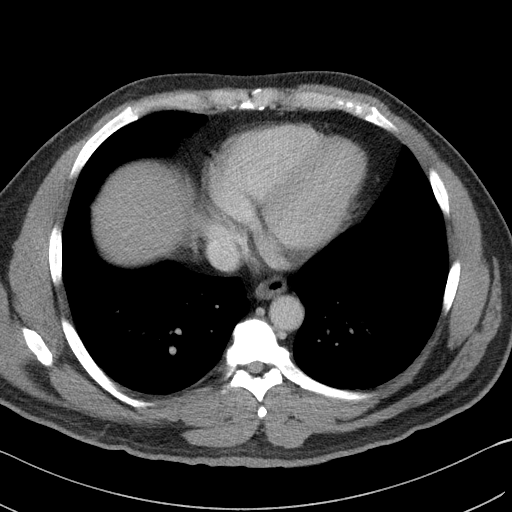

[Series 6: a/p w/ cor · coronal · 0.77mm/px · 3 of 151 slices shown]
[im 51/151  soft-tissue]
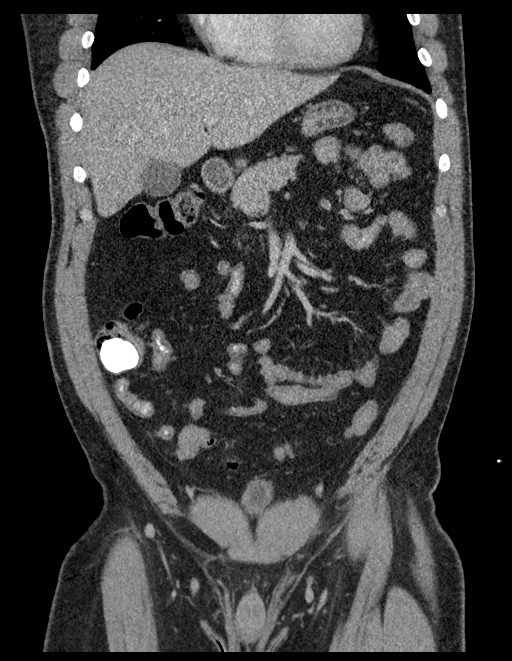
[im 67/151  soft-tissue]
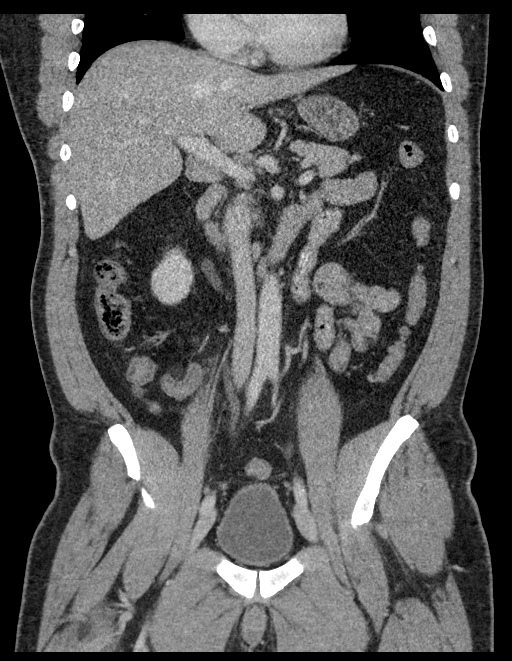
[im 84/151  soft-tissue]
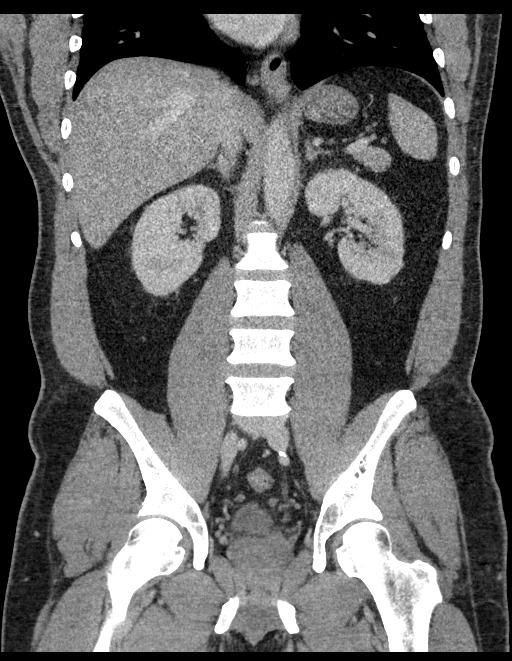

[15 of 46 positions shown; findings below may reference images not displayed]

FINDINGS: Lower chest: The visualized lung bases are grossly clear. The
visualized portions of the mediastinum are unremarkable.

Hepatobiliary: The liver is unremarkable in appearance. The
gallbladder is unremarkable in appearance. The common bile duct
remains normal in caliber.

Pancreas: The pancreas is within normal limits.

Prominent periportal and peripancreatic nodes are seen, measuring up
to 1.8 cm in short axis.

Spleen: The spleen is unremarkable in appearance.

Adrenals/Urinary Tract: The adrenal glands are unremarkable in
appearance. The kidneys are within normal limits. There is no
evidence of hydronephrosis. No renal or ureteral stones are
identified. Nonspecific perinephric stranding is noted bilaterally.

Stomach/Bowel: There is dilatation of the appendix to 1.3 cm in
maximal diameter, with surrounding soft tissue inflammation and
associated wall thickening. There is no evidence of perforation or
abscess formation at this time. Findings are compatible with acute
appendicitis.

Mild scattered diverticulosis is noted along the descending and
proximal sigmoid colon, without evidence of diverticulitis. The
small bowel is unremarkable. The stomach is unremarkable in
appearance.

Vascular/Lymphatic: Scattered calcification is seen along the
abdominal aorta and its branches. The abdominal aorta is otherwise
grossly unremarkable. The inferior vena cava is grossly
unremarkable. No retroperitoneal lymphadenopathy is seen. No pelvic
sidewall lymphadenopathy is identified.

Reproductive: The bladder is mildly distended and grossly
remarkable. The prostate is borderline normal in size.

Other: No additional soft tissue abnormalities are seen.

Musculoskeletal: No acute osseous abnormalities are identified. The
visualized musculature is unremarkable in appearance.
IMPRESSION: 1. Acute appendicitis, with dilatation of the appendix to 1.3 cm in
maximal diameter, surrounding soft tissue inflammation and
associated wall thickening. No evidence of perforation or abscess
formation at this time.
2. Prominent periportal and peripancreatic nodes measure up to
cm in short axis, of uncertain significance.
3. Mild scattered diverticulosis along the descending and proximal
sigmoid colon, without evidence of diverticulitis.
4. Scattered aortic atherosclerosis.

These results were called by telephone at the time of interpretation
on 02/17/2017 at [DATE] to Dr. ANASTACIO BYRD, who verbally
acknowledged these results.

## 2019-11-23 ENCOUNTER — Telehealth: Payer: Self-pay | Admitting: Dermatology

## 2019-11-23 NOTE — Telephone Encounter (Signed)
Phone call to patient to let him know that per Dr. Denna Haggard  To just continue his Cosentyx with the maintenance dose.

## 2019-11-23 NOTE — Telephone Encounter (Signed)
Patient skipped a month of Cosentyx due to directions given to him.  When he starts back on Cosentyx does he need to take the 150 mg dose or 300 mg dose?  Chart # [phone]

## 2020-04-12 ENCOUNTER — Other Ambulatory Visit: Payer: Self-pay | Admitting: Rheumatology

## 2020-04-12 DIAGNOSIS — R7989 Other specified abnormal findings of blood chemistry: Secondary | ICD-10-CM

## 2020-04-17 ENCOUNTER — Other Ambulatory Visit: Payer: Self-pay

## 2020-04-17 ENCOUNTER — Telehealth: Payer: Self-pay

## 2020-04-17 ENCOUNTER — Ambulatory Visit (INDEPENDENT_AMBULATORY_CARE_PROVIDER_SITE_OTHER): Payer: BC Managed Care – PPO | Admitting: Internal Medicine

## 2020-04-17 ENCOUNTER — Encounter: Payer: Self-pay | Admitting: Internal Medicine

## 2020-04-17 DIAGNOSIS — L4050 Arthropathic psoriasis, unspecified: Secondary | ICD-10-CM

## 2020-04-17 DIAGNOSIS — L409 Psoriasis, unspecified: Secondary | ICD-10-CM | POA: Insufficient documentation

## 2020-04-17 DIAGNOSIS — M1000 Idiopathic gout, unspecified site: Secondary | ICD-10-CM

## 2020-04-17 DIAGNOSIS — R748 Abnormal levels of other serum enzymes: Secondary | ICD-10-CM

## 2020-04-17 DIAGNOSIS — M109 Gout, unspecified: Secondary | ICD-10-CM | POA: Insufficient documentation

## 2020-04-17 DIAGNOSIS — M1990 Unspecified osteoarthritis, unspecified site: Secondary | ICD-10-CM | POA: Insufficient documentation

## 2020-04-17 DIAGNOSIS — M159 Polyosteoarthritis, unspecified: Secondary | ICD-10-CM

## 2020-04-17 DIAGNOSIS — N289 Disorder of kidney and ureter, unspecified: Secondary | ICD-10-CM

## 2020-04-17 DIAGNOSIS — Z227 Latent tuberculosis: Secondary | ICD-10-CM

## 2020-04-17 DIAGNOSIS — E669 Obesity, unspecified: Secondary | ICD-10-CM | POA: Insufficient documentation

## 2020-04-17 MED ORDER — RIFAMPIN 300 MG PO CAPS: 600.0000 mg | ORAL_CAPSULE | Freq: Every day | ORAL | 3 refills | Status: DC

## 2020-04-17 NOTE — Progress Notes (Signed)
Benjamin Castillo for Infectious Disease  Reason for Consult: Latent tuberculosis Referring Provider: Dr. 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  It sounds like he was first diagnosed in the 1990s.  Although he recalls being treated with a medication for TB any effective regimen would have been taken for months not weeks.  I reviewed management options with him.  I will try to obtain records from his PCP.  He would like to proceed with treatment so as to not delay further Cosentyx therapy any more than necessary.  I will start him on daily rifampin and plan on 4 months of therapy unless records indicate that he has previously been treated with a full, effective regimen.  I have talked to him about potential side effects of rifampin.  Plan: 1. I have on 600 mg daily on empty stomach with a glass of water 2. Obtain records from his PCP 3. Follow-up next week  Patient Active Problem List   Diagnosis Date Noted  . Latent tuberculosis by blood test 04/17/2020    Priority: High  . Psoriasis 04/17/2020  . Psoriatic arthritis ([place]) 04/17/2020  . Osteoarthritis 04/17/2020  . Gout 04/17/2020  . Obesity 04/17/2020  . Elevated liver enzymes 04/17/2020  . Renal insufficiency 04/17/2020  . Acute appendicitis 02/17/2017    Patient's Medications  New Prescriptions   RIFAMPIN (RIFADIN) 300 MG CAPSULE    Take 2 capsules (600 mg total) by mouth daily. Take 2 capsules daily on an empty stomach with a glass of water.  Previous Medications   COSENTYX SENSOREADY, 300 MG, 150 MG/ML SOAJ    Inject into the skin.   CYCLOBENZAPRINE (FLEXERIL) 10 MG TABLET    Take 1 tablet (10 mg total) by mouth 2 (two) times daily as needed for muscle spasms.   DICLOFENAC (VOLTAREN) 75 MG EC TABLET    diclofenac sodium 75 mg tablet,delayed release  TAKE 1 TABLET BY MOUTH TWICE A DAY   HYDROCORTISONE 2.5 % OINTMENT    Apply topically.   IBUPROFEN (ADVIL,MOTRIN) 600 MG TABLET    Take 1 tablet  (600 mg total) by mouth every 6 (six) hours as needed.   LISINOPRIL (PRINIVIL,ZESTRIL) 10 MG TABLET    Take 10 mg by mouth daily.   LULICONAZOLE 1 % CREA    Apply 0.5 g topically daily. To affected toenails and bottom of feet, including in between toes.   OLMESARTAN-HYDROCHLOROTHIAZIDE (BENICAR HCT) 20-12.5 MG TABLET       ONDANSETRON (ZOFRAN ODT) 4 MG DISINTEGRATING TABLET    Take 1 tablet (4 mg total) by mouth every 8 (eight) hours as needed for nausea or vomiting.   TREMFYA 100 MG/ML SOSY    INJECT 100MG  UNDER THE SKIN AT WEEK 0, WEEK 4, AND EVERY 8 WEEKS THEREAFTER  Modified Medications   No medications on file  Discontinued Medications   No medications on file    HPI: Benjamin Castillo is a 54 y.o. male with psoriasis and psoriatic arthritis.  He has been followed by his dermatologist, Dr. Pamala Duffel, for decades for his psoriasis.  He recalls having a positive TB skin test and the 1990s.  He recalls seeing his PCP in [place], [place], [place], [place] and taking a medication for tuberculosis.  However he thinks he only took it for several weeks.  He recently started seeing Dr. Trudie Reed for his arthritis.  She obtained a QuantiFERON gold TB assay which was positive.  There was no active  disease on chest x-ray.  In the past she has been treated with Enbrel, [place] Nail and Humira.  He was recently started on Cosentyx but was told to stop it yesterday because of the positive QuantiFERON assay.  He has not had any fever, chills, sweats, cough, sputum production, anorexia or weight loss.  He has chronic, mild dyspnea on exertion.  He received the 2 dose Moderna Covid vaccine series earlier this year.  Review of Systems: Review of Systems  Constitutional: Negative for chills, diaphoresis, fever, malaise/fatigue and weight loss.  HENT: Negative for congestion and sore throat.   Respiratory: Positive for shortness of breath. Negative for cough, hemoptysis and sputum production.   Cardiovascular:  Negative for chest pain.  Gastrointestinal: Negative for abdominal pain, diarrhea, nausea and vomiting.      Past Medical History:  Diagnosis Date  . Hypertension   . Psoriasis     Social History   Tobacco Use  . Smoking status: Never Smoker  . Smokeless tobacco: Never Used  Substance Use Topics  . Alcohol use: No  . Drug use: No    No family history on file. No Known Allergies  OBJECTIVE: Vitals:   04/17/20 1018  BP: (!) 144/95  Pulse: 70  Temp: 98.4 F (36.9 C)  SpO2: 100%  Weight: 209 lb (94.8 kg)  Height: 5\' 9"  (1.753 m)   Body mass index is 30.86 kg/m.   Physical Exam Constitutional:      Comments: He is very pleasant and in no distress.  Cardiovascular:     Rate and Rhythm: Normal rate and regular rhythm.     Heart sounds: No murmur heard.   Pulmonary:     Effort: Pulmonary effort is normal.     Breath sounds: Normal breath sounds.  Abdominal:     Palpations: Abdomen is soft.     Tenderness: There is no abdominal tenderness.  Psychiatric:        Mood and Affect: Mood normal.     Microbiology: No results found for this or any previous visit (from the past 240 hour(s)).  Michel Bickers, MD [facility] [phone] pager   [phone] cell 04/17/2020, 10:44 AM

## 2020-04-17 NOTE — Telephone Encounter (Signed)
Called office to confirm fax number. Sending signed release of information request for any TB related treatment.  Beryle Flock, RN

## 2020-04-18 ENCOUNTER — Ambulatory Visit
Admission: RE | Admit: 2020-04-18 | Discharge: 2020-04-18 | Disposition: A | Discharge status: Home / Self Care | Payer: BC Managed Care – PPO | Source: Ambulatory Visit | Attending: Rheumatology | Admitting: Rheumatology

## 2020-04-18 DIAGNOSIS — R7989 Other specified abnormal findings of blood chemistry: Secondary | ICD-10-CM

## 2020-04-26 ENCOUNTER — Ambulatory Visit: Payer: BC Managed Care – PPO | Admitting: Internal Medicine

## 2020-05-04 ENCOUNTER — Telehealth: Payer: Self-pay

## 2020-05-04 NOTE — Telephone Encounter (Signed)
Received patient's medical records from [facility]. Placed records in provider mailbox.  Beryle Flock, RN

## 2020-08-16 ENCOUNTER — Telehealth: Payer: Self-pay

## 2020-08-16 ENCOUNTER — Other Ambulatory Visit: Payer: Self-pay | Admitting: Internal Medicine

## 2020-08-16 DIAGNOSIS — Z227 Latent tuberculosis: Secondary | ICD-10-CM

## 2020-08-16 NOTE — Telephone Encounter (Signed)
Received refill request for Rifampin. Patient being treated for latent TB. Per last office visit with Dr. Orvan Falconer patient to complete 4 months of treatment and was due to follow up in the office. Patient scheduled for follow up visit with Dr. Orvan Falconer next week.  [name]

## 2020-08-16 NOTE — Telephone Encounter (Signed)
Agree. Thanks

## 2020-08-16 NOTE — Telephone Encounter (Signed)
Patient scheduled for follow up next week with Dr. Orvan Falconer.

## 2020-08-21 ENCOUNTER — Ambulatory Visit: Payer: BC Managed Care – PPO | Admitting: Internal Medicine

## 2020-08-24 ENCOUNTER — Telehealth: Payer: Self-pay | Admitting: *Deleted

## 2020-08-24 NOTE — Telephone Encounter (Signed)
Phone call to patients insurance to try to complete the Prior Authorization for his cosentyx. Patient no longer has coverage with [employer]. Phone call to patient and he did get new insurance so he is going to stop by next week and bring his new card for [place] to scan in and then we can do the PA.

## 2020-08-29 ENCOUNTER — Telehealth: Payer: Self-pay

## 2020-08-29 NOTE — Telephone Encounter (Signed)
Prior authorization done through Cover My Meds for patient's Cosentyx.   Your demographic data has been sent to [facility] successfully!  [employer] typically takes 5-10 minutes to respond, but it may take a little longer in some cases. You will be notified by email when available. You can also check for an update later by opening this request from your dashboard. Please do not fax or call [employer] to resubmit this request. If you need assistance, please chat with CoverMyMeds or call us at [phone].  If it has been longer than 24 hours, please reach out to [place].

## 2020-09-06 NOTE — Telephone Encounter (Signed)
Your PA has been resolved, no additional PA is required. For further inquiries please contact the number on the back of the member prescription card. (Message [phone])

## 2021-01-29 ENCOUNTER — Ambulatory Visit (INDEPENDENT_AMBULATORY_CARE_PROVIDER_SITE_OTHER): Payer: 59 | Admitting: Dermatology

## 2021-01-29 ENCOUNTER — Encounter: Payer: Self-pay | Admitting: Dermatology

## 2021-01-29 ENCOUNTER — Other Ambulatory Visit: Payer: Self-pay

## 2021-01-29 DIAGNOSIS — L409 Psoriasis, unspecified: Secondary | ICD-10-CM | POA: Diagnosis not present

## 2021-02-01 NOTE — Progress Notes (Signed)
   Follow-Up Visit   Subjective  Benjamin Castillo is a 55 y.o. male who presents for the following: Psoriasis (Cosentyx working keeping patients skin clear but he just received a bill for 900$ so he wants to speak with Dr Denna Haggard about possible change in therapy ).  This Location:  Duration:  Quality: Improved Associated Signs/Symptoms: Modifying Factors: Cosentyx Severity:  Timing: Context:   Objective  Well appearing patient in no apparent distress; mood and affect are within normal limits. Left Lower Leg - Anterior Psoriasis 90% clear, still some stiffness and soreness of joints but no objective inflammatory change.  We explained that he can try switching from Cosentyx to [place] but it is likely that he will have to pay his deductible with any biologic.  Cosentyx working keeping patients skin clear but he just received a bill for 900$ so he wants to speak with Dr Denna Haggard about possible change in therapy. Start approval for taltz, patient signed paper work    A focused examination was performed including arms, legs, back, upper extremity joints.. Relevant physical exam findings are noted in the Assessment and Plan.   Assessment & Plan    Psoriasis Left Lower Leg - Anterior  We will try to find out what his cost would be on Taltz therapy.  [place] will give my office a call in 2 to 3 weeks.      I, Lavonna Monarch, MD, have reviewed all documentation for this visit.  The documentation on 02/01/21 for the exam, diagnosis, procedures, and orders are all accurate and complete.

## 2021-02-04 ENCOUNTER — Telehealth: Payer: Self-pay

## 2021-02-04 NOTE — Telephone Encounter (Signed)
Paperwork for [employer] faxed to [employer] together.

## 2021-02-05 NOTE — Telephone Encounter (Signed)
RE FAXED PAGE 4 COMPLETED 6/21

## 2021-02-07 NOTE — Telephone Encounter (Signed)
Lamarr Lulas Key: [id number] - PA Case ID: [id number] Need help? Call us at [phone] Status Sent to [place] 80MG /ML auto-injectors Form [employer] Electronic PA Form [phone] NCPDP)

## 2021-02-07 NOTE — Telephone Encounter (Signed)
Royetta Crochet (Key: [id number])  Your demographic data has been sent to [facility] successfully!  [employer] typically takes 5-10 minutes to respond, but it may take a little longer in some cases. You will be notified by email when available. You can also check for an update later by opening this request from your dashboard. Please do not fax or call [employer] to resubmit this request. If you need assistance, please chat with CoverMyMeds or call us at [phone].  If it has been longer than 24 hours, please reach out to [place].

## 2021-02-27 ENCOUNTER — Telehealth: Payer: Self-pay | Admitting: Dermatology

## 2021-02-27 MED ORDER — TALTZ 80 MG/ML ~~LOC~~ SOAJ: 80.0000 mg | Freq: Once | SUBCUTANEOUS | 0 refills | Status: AC

## 2021-02-27 MED ORDER — TALTZ 80 MG/ML ~~LOC~~ SOSY: 160.0000 mg | PREFILLED_SYRINGE | SUBCUTANEOUS | 0 refills | Status: DC

## 2021-02-27 MED ORDER — TALTZ 80 MG/ML ~~LOC~~ SOAJ: 80.0000 mg | SUBCUTANEOUS | 3 refills | Status: AC

## 2021-02-27 MED ORDER — TALTZ 80 MG/ML ~~LOC~~ SOAJ: 80.0000 mg | SUBCUTANEOUS | 1 refills | Status: DC

## 2021-02-27 NOTE — Addendum Note (Signed)
Addended by: Zenia Resides on: 02/27/2021 12:35 PM   Modules accepted: Orders

## 2021-02-27 NOTE — Telephone Encounter (Signed)
Left message for patient to return our phone call - calling to seek clarification for which medication patient is going to continue and which one her wants to discontinue.

## 2021-02-27 NOTE — Telephone Encounter (Signed)
Dawn with [employer] left message on office voice mail that asking if the prior authorization for Donnetta Hail was being pursued.  Dawn stated that they had been in touch with [facility] and was told that Tarren would be continuing Cosentyx.  Dawn needs to know if Donnetta Hail is being discontinued or not.

## 2021-02-27 NOTE — Telephone Encounter (Signed)
Patient called back to let me know that Cosentyx is going to cost $2000- and wants to try taltz- so taltz prescription sent to [facility] for approval. Patient aware [facility] maybe calling them.

## 2021-03-05 ENCOUNTER — Telehealth: Payer: Self-pay | Admitting: Dermatology

## 2021-03-05 ENCOUNTER — Telehealth: Payer: Self-pay | Admitting: *Deleted

## 2021-03-05 NOTE — Telephone Encounter (Signed)
Tried calling Nichole back and got busy signal x 2- calling to inform nichole that patient is currently on cosentyx- not working & too expensive. That is why Tafeen was switching to [employer].

## 2021-03-05 NOTE — Telephone Encounter (Signed)
Called [facility] to confirm with them to dispense taltz pen with pharmacist.

## 2021-03-05 NOTE — Telephone Encounter (Signed)
Nichole with [employer] left message on office voice mail wanting to know if the patient will be put on Cosentyx or if the prior authorization for Donnetta Hail will be pursued.

## 2021-06-27 ENCOUNTER — Telehealth: Payer: Self-pay | Admitting: Hematology and Oncology

## 2021-06-27 NOTE — Telephone Encounter (Signed)
Scheduled appt per 11/10 referral. Pt is aware of appt date and time.  

## 2021-06-29 NOTE — Progress Notes (Signed)
[place] NOTE  Patient Care Team: Maximiano Coss, NP as PCP - General (Adult Health Nurse Practitioner) Lavonna Monarch, MD as Consulting Physician (Dermatology)  CHIEF COMPLAINTS/PURPOSE OF CONSULTATION:  Newly diagnosed MGUS  HISTORY OF PRESENTING ILLNESS:  Benjamin Castillo 55 y.o. male is here because of recent diagnosis of MGUS. He presents to the clinic today for initial evaluation and discussion of treatment options.  He was diagnosed with chronic kidney disease and it was felt to be related to medications.  He is under the care of nephrology.  Extensive blood work was performed to identify the cause of his chronic kidney disease and serum protein after pheresis revealed 0.9 g of IgG kappa monoclonal protein.  This prompted a referral to [place].  I reviewed his records extensively and collaborated the history with the patient.  MEDICAL HISTORY:  Past Medical History:  Diagnosis Date   Hypertension    Psoriasis     SURGICAL HISTORY: Past Surgical History:  Procedure Laterality Date   LAPAROSCOPIC APPENDECTOMY N/A 02/17/2017   Procedure: APPENDECTOMY LAPAROSCOPIC;  Surgeon: Erroll Luna, MD;  Location: [facility];  Service: General;  Laterality: N/A;    SOCIAL HISTORY: Social History   Socioeconomic History   Marital status: Married    Spouse name: Not on file   Number of children: Not on file   Years of education: Not on file   Highest education level: Not on file  Occupational History   Not on file  Tobacco Use   Smoking status: Never   Smokeless tobacco: Never  Vaping Use   Vaping Use: Never used  Substance and Sexual Activity   Alcohol use: No   Drug use: No   Sexual activity: Not on file  Other Topics Concern   Not on file  Social History Narrative   Not on file   Social Determinants of Health   Financial Resource Strain: Not on file  Food Insecurity: Not on file  Transportation Needs: Not on file  Physical Activity: Not on file   Stress: Not on file  Social Connections: Not on file  Intimate Partner Violence: Not on file    FAMILY HISTORY: No family history on file.  ALLERGIES:  has No Known Allergies.  MEDICATIONS:  Current Outpatient Medications  Medication Sig Dispense Refill   amLODipine (NORVASC) 5 MG tablet Take 1 tablet (5 mg total) by mouth daily.     atorvastatin (LIPITOR) 10 MG tablet Take 1 tablet (10 mg total) by mouth daily.     Ixekizumab (TALTZ) 80 MG/ML SOAJ Inject 80 mg into the skin every 28 (twenty-eight) days. For maintenance. 1 mL 3   olmesartan-hydrochlorothiazide (BENICAR HCT) 20-12.5 MG tablet      No current facility-administered medications for this visit.    REVIEW OF SYSTEMS:   Constitutional: Denies fevers, chills or abnormal night sweats Eyes: Denies blurriness of vision, double vision or watery eyes Ears, nose, mouth, throat, and face: Denies mucositis or sore throat Respiratory: Denies cough, dyspnea or wheezes Cardiovascular: Denies palpitation, chest discomfort or lower extremity swelling Gastrointestinal:  Denies nausea, heartburn or change in bowel habits Skin: Denies abnormal skin rashes Lymphatics: Denies new lymphadenopathy or easy bruising Neurological:Denies numbness, tingling or new weaknesses Behavioral/Psych: Mood is stable, no new changes  All other systems were reviewed with the patient and are negative.  PHYSICAL EXAMINATION: ECOG PERFORMANCE STATUS: 0 - Asymptomatic  Vitals:   07/01/21 1548  BP: (!) 147/84  Pulse: 86  Resp: 18  Temp:  97.7 F (36.5 C)  SpO2: 100%   Filed Weights   07/01/21 1548  Weight: 209 lb 9.6 oz (95.1 kg)     LABORATORY DATA:  I have reviewed the data as listed Lab Results  Component Value Date   WBC 11.3 (H) 02/18/2017   HGB 10.4 (L) 02/18/2017   HCT 32.8 (L) 02/18/2017   MCV 85.4 02/18/2017   PLT 176 02/18/2017   Lab Results  Component Value Date   NA 138 02/18/2017   K 4.0 02/18/2017   CL 108 02/18/2017    CO2 25 02/18/2017    RADIOGRAPHIC STUDIES: I have personally reviewed the radiological reports and agreed with the findings in the report.  ASSESSMENT AND PLAN:  MGUS (monoclonal gammopathy of unknown significance) Work-up was performed for chronic renal failure 06/11/2021: M spike 0.9 g IgG kappa, IgG 2278, Kappa 125, lambda 27.9, ratio 4.49, creatinine 1.69, hemoglobin 10.7, calcium 9.2, albumin 4.3 He has a history of psoriasis  Counseling: I discussed with the patient the spectrum of disorders from MGUS to multiple myeloma. We discussed the role of plasma cells in producing immunoglobulins. We discussed structure of immunoglobulins on how they make up the heavy chains and the light chains. The light chains are Kappa and lambda. I discussed the difference between MGUS and multiple myeloma. MGUS is characterized by elevation monoclonal protein without any end organ damage. Multiple myeloma is associated with elevation monoclonal protein and end organ damage (hypercalcemia, renal dysfunction, anemia, bone lytic lesions ) along with a bone marrow showing greater than 10% plasma cells.  Workup recommended: 1.  Bone survey 2. Bone marrow biopsy of the bone survey shows any abnormalities.  If the bone survey is negative then we will repeat labs and follow-up in 6 months and after that annually thereafter. Telephone visit after the bone survey to discuss results.  All questions were answered. The patient knows to call the clinic with any problems, questions or concerns.   Rulon Eisenmenger, MD, MPH 07/01/2021    I, Thana Ates, am acting as scribe for Nicholas Lose, MD.  I have reviewed the above documentation for accuracy and completeness, and I agree with the above.

## 2021-07-01 ENCOUNTER — Other Ambulatory Visit: Payer: Self-pay

## 2021-07-01 ENCOUNTER — Inpatient Hospital Stay: Payer: 59 | Attending: Hematology and Oncology | Admitting: Hematology and Oncology

## 2021-07-01 DIAGNOSIS — D472 Monoclonal gammopathy: Secondary | ICD-10-CM

## 2021-07-01 DIAGNOSIS — N189 Chronic kidney disease, unspecified: Secondary | ICD-10-CM | POA: Diagnosis not present

## 2021-07-01 MED ORDER — AMLODIPINE BESYLATE 5 MG PO TABS: 5.0000 mg | ORAL_TABLET | Freq: Every day | ORAL | Status: AC

## 2021-07-01 MED ORDER — ATORVASTATIN CALCIUM 10 MG PO TABS: 10.0000 mg | ORAL_TABLET | Freq: Every day | ORAL | Status: AC

## 2021-07-01 NOTE — Assessment & Plan Note (Signed)
Work-up was performed for chronic renal failure 06/11/2021: M spike 0.9 g IgG kappa, IgG 2278, Kappa 125, lambda 27.9, ratio 4.49, creatinine 1.69, hemoglobin 10.7, calcium 9.2, albumin 4.3  Counseling: I discussed with the patient the spectrum of disorders from MGUS to multiple myeloma. We discussed the role of plasma cells in producing immunoglobulins. We discussed structure of immunoglobulins on how they make up the heavy chains and the light chains. The light chains are Kappa and lambda. I discussed the difference between MGUS and multiple myeloma. MGUS is characterized by elevation monoclonal protein without any end organ damage. Multiple myeloma is associated with elevation monoclonal protein and end organ damage (hypercalcemia, renal dysfunction, anemia, bone lytic lesions ) along with a bone marrow showing greater than 10% plasma cells.  Workup recommended: 1.  Bone survey 2. Bone marrow biopsy

## 2021-07-08 ENCOUNTER — Other Ambulatory Visit: Payer: Self-pay

## 2021-07-08 ENCOUNTER — Ambulatory Visit (HOSPITAL_COMMUNITY)
Admission: RE | Admit: 2021-07-08 | Discharge: 2021-07-08 | Disposition: A | Discharge status: Home / Self Care | Payer: 59 | Source: Ambulatory Visit | Attending: Hematology and Oncology | Admitting: Hematology and Oncology

## 2021-07-08 DIAGNOSIS — D472 Monoclonal gammopathy: Secondary | ICD-10-CM | POA: Diagnosis present

## 2021-07-17 NOTE — Progress Notes (Signed)
  HEMATOLOGY-ONCOLOGY TELEPHONE VISIT PROGRESS NOTE  I connected with Benjamin Castillo on 07/18/2021 at  2:30 PM EST by telephone and verified that I am speaking with the correct person using two identifiers.  I discussed the limitations, risks, security and privacy concerns of performing an evaluation and management service by telephone and the availability of in person appointments.  I also discussed with the patient that there may be a patient responsible charge related to this service. The patient expressed understanding and agreed to proceed.   History of Present Illness: Benjamin Castillo is a 55 y.o. male with above-mentioned history of MGUS. He presents to the clinic today for initial evaluation and discussion of treatment options.  He was diagnosed with chronic kidney disease and it was felt to be related to medications.  He is under the care of nephrology. He presents via telephone today for follow-up.   Observations/Objective:     Assessment Plan:  MGUS (monoclonal gammopathy of unknown significance) Work-up was performed for chronic renal failure 06/11/2021: M spike 0.9 g IgG kappa, IgG 2278, Kappa 125, lambda 27.9, ratio 4.49, creatinine 1.69, hemoglobin 10.7, calcium 9.2, albumin 4.3 He has a history of psoriasis Bone survey 07/08/2021: No lytic or sclerotic lesions identified  Based on this result we can confirm that the patient has MGUS. I recommend rechecking labs in 6 months and follow-up week later to discuss results with a telephone visit    I discussed the assessment and treatment plan with the patient. The patient was provided an opportunity to ask questions and all were answered. The patient agreed with the plan and demonstrated an understanding of the instructions. The patient was advised to call back or seek an in-person evaluation if the symptoms worsen or if the condition fails to improve as anticipated.   Total time spent: 12 mins including non-face to face time and time  spent for planning, charting and coordination of care  Rulon Eisenmenger, MD 07/18/2021    I, Thana Ates, am acting as scribe for Nicholas Lose, MD.  I have reviewed the above documentation for accuracy and completeness, and I agree with the above.

## 2021-07-18 ENCOUNTER — Inpatient Hospital Stay: Payer: 59 | Attending: Hematology and Oncology | Admitting: Hematology and Oncology

## 2021-07-18 ENCOUNTER — Other Ambulatory Visit: Payer: Self-pay

## 2021-07-18 DIAGNOSIS — D472 Monoclonal gammopathy: Secondary | ICD-10-CM

## 2021-07-18 NOTE — Assessment & Plan Note (Signed)
Work-up was performed for chronic renal failure 06/11/2021: M spike 0.9 g IgG kappa, IgG 2278, Kappa 125, lambda 27.9, ratio 4.49, creatinine 1.69, hemoglobin 10.7, calcium 9.2, albumin 4.3 He has a history of psoriasis Bone survey 07/08/2021: No lytic or sclerotic lesions identified  Based on this result we can confirm that the patient has MGUS. I recommend rechecking labs in 6 months and follow-up week later to discuss results with a telephone visit

## 2021-09-30 ENCOUNTER — Telehealth: Payer: Self-pay | Admitting: Dermatology

## 2021-09-30 NOTE — Telephone Encounter (Signed)
I spoke with patient about his Donnetta Hail and he told me that his Donnetta Hail would now cost him $5,000 because they Donnetta Hail patient assistance) no longer have any available funds for him. I informed patient I would send a message to Dr. Denna Haggard and see what he recommends.

## 2021-09-30 NOTE — Telephone Encounter (Signed)
Needs to talk to someone about his Donnetta Hail

## 2021-10-02 NOTE — Telephone Encounter (Signed)
Phone call from the [employer] rep Tonee returning my call with recommendations for the patient. Per Weber Cooks the patient must have a high deductible plane and if so once he used the copay card to pay his deductible he no longer has any funds to cover the medication. Per Weber Cooks have patient call [employer] @ [phone] and see what other options they may have for the patient to get him medication.

## 2021-10-02 NOTE — Telephone Encounter (Signed)
Phone call to [facility] who is the [employer] rep per Dr. Onalee Hua verbal recommendation to see if their able to assist patient with continuing his Donnetta Hail before changing patient's medications. Voicemail left for Tonee to give the office a call back.

## 2021-10-02 NOTE — Telephone Encounter (Signed)
Phone call to patient to give him the number to [facility] to see if their able to give him some assistance with getting his medication. Patient wanted to know if I would be able to text the number to him?  I informed patient I would be able to send the number to him through [place]. Patient aware and okay with that.

## 2021-12-05 ENCOUNTER — Telehealth: Payer: Self-pay | Admitting: Dermatology

## 2021-12-05 NOTE — Telephone Encounter (Signed)
Patient calling for refill of TALTZ. He also said he needs a new copay card also because medication is too expensive. He is ok with alternate medication too. His last visit here was 01/2021. I scheduled another psoriasis follow up appointment for patient also. [facility] [address]. ?

## 2021-12-05 NOTE — Telephone Encounter (Signed)
Phone call to patient to inform him that we can give him samples until his appointment. Patient states that he's on his way to pick up the samples.  ?

## 2022-01-16 ENCOUNTER — Other Ambulatory Visit: Payer: 59

## 2022-01-23 ENCOUNTER — Telehealth: Payer: 59 | Admitting: Hematology and Oncology

## 2022-02-10 ENCOUNTER — Ambulatory Visit (INDEPENDENT_AMBULATORY_CARE_PROVIDER_SITE_OTHER): Payer: 59 | Admitting: Dermatology

## 2022-02-10 DIAGNOSIS — L400 Psoriasis vulgaris: Secondary | ICD-10-CM | POA: Diagnosis not present

## 2022-02-10 MED ORDER — VTAMA 1 % EX CREA: 1.0000 | TOPICAL_CREAM | Freq: Every day | CUTANEOUS | 3 refills | Status: AC

## 2022-02-13 ENCOUNTER — Other Ambulatory Visit: Payer: Self-pay

## 2022-02-13 ENCOUNTER — Inpatient Hospital Stay: Payer: 59 | Attending: Hematology and Oncology

## 2022-02-13 DIAGNOSIS — D472 Monoclonal gammopathy: Secondary | ICD-10-CM | POA: Insufficient documentation

## 2022-02-13 LAB — CBC WITH DIFFERENTIAL (CANCER CENTER ONLY)
Abs Immature Granulocytes: 0.01 10*3/uL (ref 0.00–0.07)
Basophils Absolute: 0 10*3/uL (ref 0.0–0.1)
Basophils Relative: 0 %
Eosinophils Absolute: 0.2 10*3/uL (ref 0.0–0.5)
Eosinophils Relative: 3 %
HCT: 32.5 % — ABNORMAL LOW (ref 39.0–52.0)
Hemoglobin: 10.4 g/dL — ABNORMAL LOW (ref 13.0–17.0)
Immature Granulocytes: 0 %
Lymphocytes Relative: 37 %
Lymphs Abs: 1.8 10*3/uL (ref 0.7–4.0)
MCH: 28.3 pg (ref 26.0–34.0)
MCHC: 32 g/dL (ref 30.0–36.0)
MCV: 88.3 fL (ref 80.0–100.0)
Monocytes Absolute: 0.4 10*3/uL (ref 0.1–1.0)
Monocytes Relative: 9 %
Neutro Abs: 2.5 10*3/uL (ref 1.7–7.7)
Neutrophils Relative %: 51 %
Platelet Count: 249 10*3/uL (ref 150–400)
RBC: 3.68 MIL/uL — ABNORMAL LOW (ref 4.22–5.81)
RDW: 15.5 % (ref 11.5–15.5)
WBC Count: 4.9 10*3/uL (ref 4.0–10.5)
nRBC: 0 % (ref 0.0–0.2)

## 2022-02-13 LAB — CMP (CANCER CENTER ONLY)
ALT: 46 U/L — ABNORMAL HIGH (ref 0–44)
AST: 42 U/L — ABNORMAL HIGH (ref 15–41)
Albumin: 4.2 g/dL (ref 3.5–5.0)
Alkaline Phosphatase: 40 U/L (ref 38–126)
Anion gap: 6 (ref 5–15)
BUN: 18 mg/dL (ref 6–20)
CO2: 28 mmol/L (ref 22–32)
Calcium: 9.6 mg/dL (ref 8.9–10.3)
Chloride: 107 mmol/L (ref 98–111)
Creatinine: 1.91 mg/dL — ABNORMAL HIGH (ref 0.61–1.24)
GFR, Estimated: 41 mL/min — ABNORMAL LOW (ref >60)
Glucose, Bld: 89 mg/dL (ref 70–99)
Potassium: 3.8 mmol/L (ref 3.5–5.1)
Sodium: 141 mmol/L (ref 135–145)
Total Bilirubin: 0.2 mg/dL — ABNORMAL LOW (ref 0.3–1.2)
Total Protein: 8.5 g/dL — ABNORMAL HIGH (ref 6.5–8.1)

## 2022-02-14 LAB — KAPPA/LAMBDA LIGHT CHAINS
Kappa free light chain: 141.4 mg/L — ABNORMAL HIGH (ref 3.3–19.4)
Kappa, lambda light chain ratio: 4.5 — ABNORMAL HIGH (ref 0.26–1.65)
Lambda free light chains: 31.4 mg/L — ABNORMAL HIGH (ref 5.7–26.3)

## 2022-02-18 LAB — MULTIPLE MYELOMA PANEL, SERUM
Albumin SerPl Elph-Mcnc: 3.7 g/dL (ref 2.9–4.4)
Albumin/Glob SerPl: 0.9 (ref 0.7–1.7)
Alpha 1: 0.2 g/dL (ref 0.0–0.4)
Alpha2 Glob SerPl Elph-Mcnc: 0.7 g/dL (ref 0.4–1.0)
B-Globulin SerPl Elph-Mcnc: 1.1 g/dL (ref 0.7–1.3)
Gamma Glob SerPl Elph-Mcnc: 2.3 g/dL — ABNORMAL HIGH (ref 0.4–1.8)
Globulin, Total: 4.4 g/dL — ABNORMAL HIGH (ref 2.2–3.9)
IgA: 215 mg/dL (ref 90–386)
IgG (Immunoglobin G), Serum: 2290 mg/dL — ABNORMAL HIGH (ref 603–1613)
IgM (Immunoglobulin M), Srm: 87 mg/dL (ref 20–172)
M Protein SerPl Elph-Mcnc: 1.1 g/dL — ABNORMAL HIGH
Total Protein ELP: 8.1 g/dL (ref 6.0–8.5)

## 2022-02-20 ENCOUNTER — Inpatient Hospital Stay: Payer: 59 | Attending: Hematology and Oncology | Admitting: Hematology and Oncology

## 2022-02-20 DIAGNOSIS — E66812 Obesity, class 2: Secondary | ICD-10-CM

## 2022-02-20 DIAGNOSIS — L4050 Arthropathic psoriasis, unspecified: Secondary | ICD-10-CM

## 2022-02-20 DIAGNOSIS — D472 Monoclonal gammopathy: Secondary | ICD-10-CM

## 2022-02-20 NOTE — Progress Notes (Signed)
Patient did not answer her phone call that I did couple of times today.

## 2022-02-20 NOTE — Assessment & Plan Note (Signed)
Work-up was performed for chronic renal failure 06/11/2021: M spike 0.9 g IgG kappa, IgG 2278, Kappa 125, lambda 27.9, ratio 4.49, creatinine 1.69, hemoglobin 10.7, calcium 9.2, albumin 4.3 He has a history of psoriasis Bone survey 07/08/2021: No lytic or sclerotic lesions identified  02/13/2022: M spike 1.1 g IgG kappa, kappa 141, lambda 31.4, ratio 4.5, calcium 9.6, creatinine 1.91, hemoglobin 10.4  I discussed with the patient that with the increase in the M protein levels I do recommend another bone marrow biopsy.

## 2022-03-05 ENCOUNTER — Telehealth: Payer: Self-pay | Admitting: Dermatology

## 2022-03-05 NOTE — Telephone Encounter (Signed)
Patient is calling to ask if Humira or Enbrel can contribute to high blood pressure.  Patient is also calling to ask about the new medication that Lavonna Monarch, M.D. talked about at his last visit.

## 2022-03-09 ENCOUNTER — Encounter: Payer: Self-pay | Admitting: Dermatology

## 2022-03-09 NOTE — Progress Notes (Signed)
   Follow-Up Visit   Subjective  Benjamin Castillo is a 56 y.o. male who presents for the following: Follow-up (Yearly psoriasis follow up. Patient taking taltz. Patient does have breakout on left leg. 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 ).  Psoriasis, minor flare on [place] Location:  Duration:  Quality:  Associated Signs/Symptoms: Modifying Factors:  Severity:  Timing: Context:   Objective  Well appearing patient in no apparent distress; mood and affect are within normal limits. Ongoing issue with systemic psoriasis therapies being unaffordable for patient.  Starkly has had joint stiffness and some pain; I emphasized that psoriatic arthritis is less reversible and skin changes.    A focused examination was performed including arms, legs, back, scalp, joints. Relevant physical exam findings are noted in the Assessment and Plan.   Assessment & Plan    Plaque psoriasis  We will see if he can get the time at a reasonable out-of-pocket cost for his existing psoriatic plaques.  We also may consider going to the new sotyktu which may be available at no cost for 1 to 3 years.  Tapinarof (VTAMA) 1 % CREA Apply 1 Application topically daily.      I, Lavonna Monarch, MD, have reviewed all documentation for this visit.  The documentation on 03/09/22 for the exam, diagnosis, procedures, and orders are all accurate and complete.

## 2022-09-25 ENCOUNTER — Telehealth: Payer: Self-pay | Admitting: Hematology and Oncology

## 2022-09-25 NOTE — Telephone Encounter (Signed)
Scheduled appt per 2/8 referral. Pt is aware of appt date and time. Pt is aware to arrive 15 mins prior to appt time and to bring and updated insurance card. Pt is aware of appt location.   

## 2022-10-09 NOTE — Progress Notes (Signed)
   Patient Care Team: Lavonna Monarch, MD (Inactive) as Consulting Physician (Dermatology)  DIAGNOSIS: No diagnosis found.  SUMMARY OF ONCOLOGIC HISTORY: Oncology History   No history exists.    CHIEF COMPLIANT:   INTERVAL HISTORY: Benjamin Castillo is a 57 y.o. male with above-mentioned history of MGUS. He presents to the clinic today for a follow-up.   ALLERGIES:  has No Known Allergies.  MEDICATIONS:  Current Outpatient Medications  Medication Sig Dispense Refill   amLODipine (NORVASC) 5 MG tablet Take 1 tablet (5 mg total) by mouth daily.     atorvastatin (LIPITOR) 10 MG tablet Take 1 tablet (10 mg total) by mouth daily.     Ixekizumab (TALTZ) 80 MG/ML SOAJ Inject 80 mg into the skin every 28 (twenty-eight) days. For maintenance. 1 mL 3   olmesartan-hydrochlorothiazide (BENICAR HCT) 20-12.5 MG tablet      Tapinarof (VTAMA) 1 % CREA Apply 1 Application topically daily. 60 g 3   No current facility-administered medications for this visit.    PHYSICAL EXAMINATION: ECOG PERFORMANCE STATUS: {CHL ONC ECOG [phone]}  There were no vitals filed for this visit. There were no vitals filed for this visit.  BREAST:*** No palpable masses or nodules in either right or left breasts. No palpable axillary supraclavicular or infraclavicular adenopathy no breast tenderness or nipple discharge. (exam performed in the presence of a chaperone)  LABORATORY DATA:  I have reviewed the data as listed    Latest Ref Rng & Units 02/13/2022    2:10 PM 02/18/2017    7:14 AM 02/17/2017   12:14 AM  CMP  Glucose 70 - 99 mg/dL 89  142  118   BUN 6 - 20 mg/dL 18  14  19   $ Creatinine 0.61 - 1.24 mg/dL 1.91  1.43  1.73   Sodium 135 - 145 mmol/L 141  138  137   Potassium 3.5 - 5.1 mmol/L 3.8  4.0  3.6   Chloride 98 - 111 mmol/L 107  108  102   CO2 22 - 32 mmol/L 28  25  29   $ Calcium 8.9 - 10.3 mg/dL 9.6  8.1  9.6   Total Protein 6.5 - 8.1 g/dL 8.5   8.4   Total Bilirubin 0.3 - 1.2 mg/dL 0.2   0.6    Alkaline Phos 38 - 126 U/L 40   36   AST 15 - 41 U/L 42   111   ALT 0 - 44 U/L 46   226     Lab Results  Component Value Date   WBC 4.9 02/13/2022   HGB 10.4 (L) 02/13/2022   HCT 32.5 (L) 02/13/2022   MCV 88.3 02/13/2022   PLT 249 02/13/2022   NEUTROABS 2.5 02/13/2022    ASSESSMENT & PLAN:  No problem-specific Assessment & Plan notes found for this encounter.    No orders of the defined types were placed in this encounter.  The patient has a good understanding of the overall plan. he agrees with it. he will call with any problems that may develop before the next visit here. Total time spent: 30 mins including face to face time and time spent for planning, charting and co-ordination of care   [name], [place] 10/09/22    I Gardiner Coins am acting as a Education administrator for [employer]  ***

## 2022-10-14 ENCOUNTER — Other Ambulatory Visit: Payer: Self-pay

## 2022-10-14 ENCOUNTER — Inpatient Hospital Stay: Payer: Non-veteran care | Attending: Hematology and Oncology | Admitting: Hematology and Oncology

## 2022-10-14 ENCOUNTER — Inpatient Hospital Stay: Payer: No Typology Code available for payment source

## 2022-10-14 VITALS — BP 149/90 | HR 86 | Temp 98.4°F | Resp 20 | Wt 205.9 lb

## 2022-10-14 DIAGNOSIS — Z79899 Other long term (current) drug therapy: Secondary | ICD-10-CM | POA: Insufficient documentation

## 2022-10-14 DIAGNOSIS — D472 Monoclonal gammopathy: Secondary | ICD-10-CM

## 2022-10-14 LAB — CMP (CANCER CENTER ONLY)
ALT: 58 U/L — ABNORMAL HIGH (ref 0–44)
AST: 43 U/L — ABNORMAL HIGH (ref 15–41)
Albumin: 4.3 g/dL (ref 3.5–5.0)
Alkaline Phosphatase: 42 U/L (ref 38–126)
Anion gap: 7 (ref 5–15)
BUN: 12 mg/dL (ref 6–20)
CO2: 29 mmol/L (ref 22–32)
Calcium: 9.4 mg/dL (ref 8.9–10.3)
Chloride: 105 mmol/L (ref 98–111)
Creatinine: 1.5 mg/dL — ABNORMAL HIGH (ref 0.61–1.24)
GFR, Estimated: 54 mL/min — ABNORMAL LOW (ref >60)
Glucose, Bld: 97 mg/dL (ref 70–99)
Potassium: 3.9 mmol/L (ref 3.5–5.1)
Sodium: 141 mmol/L (ref 135–145)
Total Bilirubin: 0.3 mg/dL (ref 0.3–1.2)
Total Protein: 8.7 g/dL — ABNORMAL HIGH (ref 6.5–8.1)

## 2022-10-14 LAB — CBC WITH DIFFERENTIAL (CANCER CENTER ONLY)
Abs Immature Granulocytes: 0.01 10*3/uL (ref 0.00–0.07)
Basophils Absolute: 0 10*3/uL (ref 0.0–0.1)
Basophils Relative: 1 %
Eosinophils Absolute: 0.1 10*3/uL (ref 0.0–0.5)
Eosinophils Relative: 3 %
HCT: 35.9 % — ABNORMAL LOW (ref 39.0–52.0)
Hemoglobin: 11.7 g/dL — ABNORMAL LOW (ref 13.0–17.0)
Immature Granulocytes: 0 %
Lymphocytes Relative: 35 %
Lymphs Abs: 1.5 10*3/uL (ref 0.7–4.0)
MCH: 28.5 pg (ref 26.0–34.0)
MCHC: 32.6 g/dL (ref 30.0–36.0)
MCV: 87.3 fL (ref 80.0–100.0)
Monocytes Absolute: 0.4 10*3/uL (ref 0.1–1.0)
Monocytes Relative: 10 %
Neutro Abs: 2.3 10*3/uL (ref 1.7–7.7)
Neutrophils Relative %: 51 %
Platelet Count: 269 10*3/uL (ref 150–400)
RBC: 4.11 MIL/uL — ABNORMAL LOW (ref 4.22–5.81)
RDW: 15.4 % (ref 11.5–15.5)
WBC Count: 4.4 10*3/uL (ref 4.0–10.5)
nRBC: 0 % (ref 0.0–0.2)

## 2022-10-14 NOTE — Assessment & Plan Note (Signed)
Work-up was performed for chronic renal failure 06/11/2021: M spike 0.9 g IgG kappa, IgG 2278, Kappa 125, lambda 27.9, ratio 4.49, creatinine 1.69, hemoglobin 10.7, calcium 9.2, albumin 4.3 He has a history of psoriasis Bone survey 07/08/2021: No lytic or sclerotic lesions identified  02/13/2022: M spike 1.1 g IgG kappa, kappa 141, lambda 31.4, ratio 4.5, calcium 9.6, creatinine 1.91, hemoglobin 10.4 Patient needs blood work and possibly a repeat bone marrow biopsy.

## 2022-10-15 LAB — KAPPA/LAMBDA LIGHT CHAINS
Kappa free light chain: 115.2 mg/L — ABNORMAL HIGH (ref 3.3–19.4)
Kappa, lambda light chain ratio: 4.06 — ABNORMAL HIGH (ref 0.26–1.65)
Lambda free light chains: 28.4 mg/L — ABNORMAL HIGH (ref 5.7–26.3)

## 2022-10-15 LAB — BETA 2 MICROGLOBULIN, SERUM: Beta-2 Microglobulin: 3 mg/L — ABNORMAL HIGH (ref 0.6–2.4)

## 2022-10-20 LAB — MULTIPLE MYELOMA PANEL, SERUM
Albumin SerPl Elph-Mcnc: 4 g/dL (ref 2.9–4.4)
Albumin/Glob SerPl: 1 (ref 0.7–1.7)
Alpha 1: 0.3 g/dL (ref 0.0–0.4)
Alpha2 Glob SerPl Elph-Mcnc: 0.8 g/dL (ref 0.4–1.0)
B-Globulin SerPl Elph-Mcnc: 1.1 g/dL (ref 0.7–1.3)
Gamma Glob SerPl Elph-Mcnc: 2 g/dL — ABNORMAL HIGH (ref 0.4–1.8)
Globulin, Total: 4.2 g/dL — ABNORMAL HIGH (ref 2.2–3.9)
IgA: 203 mg/dL (ref 90–386)
IgG (Immunoglobin G), Serum: 2085 mg/dL — ABNORMAL HIGH (ref 603–1613)
IgM (Immunoglobulin M), Srm: 80 mg/dL (ref 20–172)
M Protein SerPl Elph-Mcnc: 0.9 g/dL — ABNORMAL HIGH
Total Protein ELP: 8.2 g/dL (ref 6.0–8.5)

## 2022-10-21 NOTE — Progress Notes (Signed)
   Patient Care Team: Benjamin Lose, MD as PCP - General (Hematology and Oncology) Benjamin Monarch, MD (Inactive) as Consulting Physician (Dermatology)  DIAGNOSIS: No diagnosis found.  SUMMARY OF ONCOLOGIC HISTORY: Oncology History   No history exists.    CHIEF COMPLIANT:   INTERVAL HISTORY: Benjamin Castillo is a   ALLERGIES:  has No Known Allergies.  MEDICATIONS:  Current Outpatient Medications  Medication Sig Dispense Refill   allopurinol (ZYLOPRIM) 100 MG tablet      amLODipine (NORVASC) 5 MG tablet Take 1 tablet (5 mg total) by mouth daily.     atorvastatin (LIPITOR) 10 MG tablet Take 1 tablet (10 mg total) by mouth daily.     Ixekizumab (TALTZ) 80 MG/ML SOAJ Inject 80 mg into the skin every 28 (twenty-eight) days. For maintenance. 1 mL 3   olmesartan (BENICAR) 20 MG tablet Take 20 mg by mouth daily.     olmesartan-hydrochlorothiazide (BENICAR HCT) 20-12.5 MG tablet      Tapinarof (VTAMA) 1 % CREA Apply 1 Application topically daily. 60 g 3   No current facility-administered medications for this visit.    PHYSICAL EXAMINATION: ECOG PERFORMANCE STATUS: {CHL ONC ECOG PS:[phone]}  There were no vitals filed for this visit. There were no vitals filed for this visit.  BREAST:*** No palpable masses or nodules in either right or left breasts. No palpable axillary supraclavicular or infraclavicular adenopathy no breast tenderness or nipple discharge. (exam performed in the presence of a chaperone)  LABORATORY DATA:  I have reviewed the data as listed    Latest Ref Rng & Units 10/14/2022   10:05 AM 02/13/2022    2:10 PM 02/18/2017    7:14 AM  CMP  Glucose 70 - 99 mg/dL 97  89  142   BUN 6 - 20 mg/dL '12  18  14   '$ Creatinine 0.61 - 1.24 mg/dL 1.50  1.91  1.43   Sodium 135 - 145 mmol/L 141  141  138   Potassium 3.5 - 5.1 mmol/L 3.9  3.8  4.0   Chloride 98 - 111 mmol/L 105  107  108   CO2 22 - 32 mmol/L '29  28  25   '$ Calcium 8.9 - 10.3 mg/dL 9.4  9.6  8.1   Total Protein 6.5  - 8.1 g/dL 8.7  8.5    Total Bilirubin 0.3 - 1.2 mg/dL 0.3  0.2    Alkaline Phos 38 - 126 U/L 42  40    AST 15 - 41 U/L 43  42    ALT 0 - 44 U/L 58  46      Lab Results  Component Value Date   WBC 4.4 10/14/2022   HGB 11.7 (L) 10/14/2022   HCT 35.9 (L) 10/14/2022   MCV 87.3 10/14/2022   PLT 269 10/14/2022   NEUTROABS 2.3 10/14/2022    ASSESSMENT & PLAN:  No problem-specific Assessment & Plan notes found for this encounter.    No orders of the defined types were placed in this encounter.  The patient has a good understanding of the overall plan. he agrees with it. he will call with any problems that may develop before the next visit here. Total time spent: 30 mins including face to face time and time spent for planning, charting and co-ordination of care   Benjamin Castillo, [place] 10/21/22    I Benjamin Castillo am acting as a Education administrator for Benjamin Castillo  ***

## 2022-10-23 ENCOUNTER — Inpatient Hospital Stay
Payer: No Typology Code available for payment source | Attending: Hematology and Oncology | Admitting: Hematology and Oncology

## 2022-10-23 ENCOUNTER — Other Ambulatory Visit: Payer: Self-pay

## 2022-10-23 VITALS — BP 160/98 | HR 76 | Temp 97.9°F | Resp 18 | Ht 69.0 in | Wt 210.9 lb

## 2022-10-23 DIAGNOSIS — Z79899 Other long term (current) drug therapy: Secondary | ICD-10-CM | POA: Insufficient documentation

## 2022-10-23 DIAGNOSIS — D472 Monoclonal gammopathy: Secondary | ICD-10-CM | POA: Diagnosis not present

## 2022-10-23 NOTE — Assessment & Plan Note (Signed)
Work-up was performed for chronic renal failure 06/11/2021: M spike 0.9 g IgG kappa, IgG 2278, Kappa 125, lambda 27.9, ratio 4.49, creatinine 1.69, hemoglobin 10.7, calcium 9.2, albumin 4.3 He has a history of psoriasis Bone survey 07/08/2021: No lytic or sclerotic lesions identified   02/13/2022: M spike 1.1 g IgG kappa, kappa 141, lambda 31.4, ratio 4.5, calcium 9.6, creatinine 1.91, hemoglobin 10.4 10/14/2022: M spike 0.9 g, IgG kappa, kappa 115.2, lambda 28.4, ratio 4.06, calcium 9.4, creatinine 1.5 hemoglobin 11.7  I discussed with the patient that the labs showed improvement. Therefore we can perform these labs once a year and follow-up 1 week later with a telephone visit.

## 2023-10-13 ENCOUNTER — Telehealth: Payer: Self-pay | Admitting: *Deleted

## 2023-10-13 ENCOUNTER — Inpatient Hospital Stay: Payer: Non-veteran care | Attending: Hematology and Oncology

## 2023-10-13 DIAGNOSIS — D472 Monoclonal gammopathy: Secondary | ICD-10-CM | POA: Diagnosis present

## 2023-10-13 LAB — CMP (CANCER CENTER ONLY)
ALT: 25 U/L (ref 0–44)
AST: 22 U/L (ref 15–41)
Albumin: 4.3 g/dL (ref 3.5–5.0)
Alkaline Phosphatase: 35 U/L — ABNORMAL LOW (ref 38–126)
Anion gap: 8 (ref 5–15)
BUN: 24 mg/dL — ABNORMAL HIGH (ref 6–20)
CO2: 26 mmol/L (ref 22–32)
Calcium: 9.7 mg/dL (ref 8.9–10.3)
Chloride: 106 mmol/L (ref 98–111)
Creatinine: 1.95 mg/dL — ABNORMAL HIGH (ref 0.61–1.24)
GFR, Estimated: 39 mL/min — ABNORMAL LOW (ref >60)
Glucose, Bld: 98 mg/dL (ref 70–99)
Potassium: 3.7 mmol/L (ref 3.5–5.1)
Sodium: 140 mmol/L (ref 135–145)
Total Bilirubin: 0.4 mg/dL (ref 0.0–1.2)
Total Protein: 8.9 g/dL — ABNORMAL HIGH (ref 6.5–8.1)

## 2023-10-13 LAB — CBC WITH DIFFERENTIAL (CANCER CENTER ONLY)
Abs Immature Granulocytes: 0.01 10*3/uL (ref 0.00–0.07)
Basophils Absolute: 0 10*3/uL (ref 0.0–0.1)
Basophils Relative: 0 %
Eosinophils Absolute: 0.2 10*3/uL (ref 0.0–0.5)
Eosinophils Relative: 3 %
HCT: 37.9 % — ABNORMAL LOW (ref 39.0–52.0)
Hemoglobin: 12.3 g/dL — ABNORMAL LOW (ref 13.0–17.0)
Immature Granulocytes: 0 %
Lymphocytes Relative: 35 %
Lymphs Abs: 1.9 10*3/uL (ref 0.7–4.0)
MCH: 28.5 pg (ref 26.0–34.0)
MCHC: 32.5 g/dL (ref 30.0–36.0)
MCV: 87.7 fL (ref 80.0–100.0)
Monocytes Absolute: 0.5 10*3/uL (ref 0.1–1.0)
Monocytes Relative: 9 %
Neutro Abs: 2.9 10*3/uL (ref 1.7–7.7)
Neutrophils Relative %: 53 %
Platelet Count: 307 10*3/uL (ref 150–400)
RBC: 4.32 MIL/uL (ref 4.22–5.81)
RDW: 14.5 % (ref 11.5–15.5)
WBC Count: 5.5 10*3/uL (ref 4.0–10.5)
nRBC: 0 % (ref 0.0–0.2)

## 2023-10-13 NOTE — Telephone Encounter (Signed)
 Received VM from pt requesting our office to submit auth to [facility] for pt to continue following up with MD.  RN forwarded VM to PA team to address.

## 2023-10-14 LAB — KAPPA/LAMBDA LIGHT CHAINS
Kappa free light chain: 117.3 mg/L — ABNORMAL HIGH (ref 3.3–19.4)
Kappa, lambda light chain ratio: 4.16 — ABNORMAL HIGH (ref 0.26–1.65)
Lambda free light chains: 28.2 mg/L — ABNORMAL HIGH (ref 5.7–26.3)

## 2023-10-15 ENCOUNTER — Telehealth: Payer: Self-pay | Admitting: *Deleted

## 2023-10-15 NOTE — Telephone Encounter (Signed)
 Received call from pt stating the [facility] needs some sort of certification to come to his visit with [place] on 10/26/23.  RN forwarded pt to PA team.

## 2023-10-16 LAB — MULTIPLE MYELOMA PANEL, SERUM
Albumin SerPl Elph-Mcnc: 3.5 g/dL (ref 2.9–4.4)
Albumin/Glob SerPl: 0.9 (ref 0.7–1.7)
Alpha 1: 0.2 g/dL (ref 0.0–0.4)
Alpha2 Glob SerPl Elph-Mcnc: 0.8 g/dL (ref 0.4–1.0)
B-Globulin SerPl Elph-Mcnc: 1.2 g/dL (ref 0.7–1.3)
Gamma Glob SerPl Elph-Mcnc: 2 g/dL — ABNORMAL HIGH (ref 0.4–1.8)
Globulin, Total: 4.3 g/dL — ABNORMAL HIGH (ref 2.2–3.9)
IgA: 214 mg/dL (ref 90–386)
IgG (Immunoglobin G), Serum: 2134 mg/dL — ABNORMAL HIGH (ref 603–1613)
IgM (Immunoglobulin M), Srm: 91 mg/dL (ref 20–172)
M Protein SerPl Elph-Mcnc: 0.8 g/dL — ABNORMAL HIGH
Total Protein ELP: 7.8 g/dL (ref 6.0–8.5)

## 2023-10-26 ENCOUNTER — Telehealth: Payer: Self-pay | Admitting: Hematology and Oncology

## 2023-10-26 ENCOUNTER — Inpatient Hospital Stay: Payer: Self-pay | Admitting: Hematology and Oncology

## 2023-10-26 ENCOUNTER — Telehealth: Payer: Self-pay

## 2023-10-26 NOTE — Telephone Encounter (Signed)
 Pt called this morning with concern regarding the need for cancelling his appt today. He states the [place] denied his ability to come here and is asking for our assistance. He provided the number for [facility], an oncology nurse navigator with the [place] [phone]. Attempted to call Brianna and LVM for call back so we can understand why the pt is not able to come in.

## 2023-10-26 NOTE — Assessment & Plan Note (Deleted)
 Work-up was performed for chronic renal failure 06/11/2021: M spike 0.9 g IgG kappa, IgG 2278, Kappa 125, lambda 27.9, ratio 4.49, creatinine 1.69, hemoglobin 10.7, calcium 9.2, albumin 4.3 He has a history of psoriasis Bone survey 07/08/2021: No lytic or sclerotic lesions identified  Lab review: 02/13/2022: M spike 1.1 g IgG kappa, kappa 141, lambda 31.4, ratio 4.5, calcium 9.6, creatinine 1.91, hemoglobin 10.4 10/14/2022: M spike 0.9 g, IgG kappa, kappa 115.2, lambda 28.4, ratio 4.06, calcium 9.4, creatinine 1.5 hemoglobin 11.7 10/13/2023: M protein 0.8 g IgG kappa, kappa 117.3, lambda 28.2, ratio 4.16, calcium 9.7, creatinine 1.95, hemoglobin 12.3  I discussed with the patient that 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  Therefore no additional testing is required. Recheck labs in 1 year and follow-up 2 weeks later.

## 2023-10-26 NOTE — Telephone Encounter (Signed)
 Called pt about missed appt pt states that he called in to cancel appt due to his community care from the [place] pt will call back to reschedule when he gets any updated information.

## 2023-11-03 ENCOUNTER — Telehealth: Payer: Self-pay | Admitting: *Deleted

## 2023-11-03 NOTE — Telephone Encounter (Signed)
 Received call from [facility] community care [phone]) stating pt was denied for renewal to f/u with our MD.  States pt is scheduled 11/17/23 with [facility] oncology team. Pt is aware of this change.

## 2024-04-21 ENCOUNTER — Encounter: Payer: Self-pay | Admitting: Internal Medicine

## 2024-04-22 ENCOUNTER — Other Ambulatory Visit (HOSPITAL_COMMUNITY): Payer: Self-pay | Admitting: Internal Medicine

## 2024-04-22 DIAGNOSIS — R809 Proteinuria, unspecified: Secondary | ICD-10-CM

## 2024-05-17 ENCOUNTER — Other Ambulatory Visit (HOSPITAL_COMMUNITY): Payer: Self-pay | Admitting: Student

## 2024-05-17 DIAGNOSIS — N289 Disorder of kidney and ureter, unspecified: Secondary | ICD-10-CM

## 2024-05-19 ENCOUNTER — Other Ambulatory Visit: Payer: Self-pay

## 2024-05-19 NOTE — H&P (Signed)
 Chief Complaint: Patient was seen in consultation today for proteinuria, with consideration for random renal biopsy.  Referring Provider(s): Dr. Patience Motto, MD [facility])  Supervising Physician: Philip Cornet  Patient Status: [facility] - Out-pt  Patient is Full Code  History of Present Illness: Benjamin Castillo is a 58 y.o. male  with PMHx notable for proteinuria, CKD stage III, HTN, HLD, gout, monoclonal gammopathy, and psoriasis.   Few notes are available for review.  Patient had recently established with Nephrology at [facility] and recommended for random renal biopsy.  However, this was not scheduled for unknown reasons.  Per review of Dr. Delberta order request, patient has nephrotic range proteinuria, and positive ANA.  She has recommended random renal biopsy. Interventional Radiology was requested for random renal biopsy.  Patient is scheduled for renal biopsy in IR today.   Patient is alert and laying in bed, calm. Wife is at bedside. Patient is currently without any significant complaints.  Patient denies any fevers, headache, chest pain, SOB, cough, abdominal pain, nausea, vomiting or bleeding.    Past Medical History:  Diagnosis Date   Hypertension    Psoriasis     Past Surgical History:  Procedure Laterality Date   LAPAROSCOPIC APPENDECTOMY N/A 02/17/2017   Procedure: APPENDECTOMY LAPAROSCOPIC;  Surgeon: Vanderbilt Ned, MD;  Location: [facility] OR;  Service: General;  Laterality: N/A;    Allergies: Patient has no known allergies.  Medications: Prior to Admission medications   Medication Sig Start Date End Date Taking? Authorizing Provider  allopurinol  (ZYLOPRIM ) 100 MG tablet  09/03/21   [provider]  amLODipine  (NORVASC ) 5 MG tablet Take 1 tablet (5 mg total) by mouth daily. 07/01/21   Gudena, Vinay, MD  atorvastatin  (LIPITOR) 10 MG tablet Take 1 tablet (10 mg total) by mouth daily. 07/01/21   Gudena, Vinay, MD  Ixekizumab   (TALTZ ) 80 MG/ML SOAJ Inject 80 mg into the skin every 28 (twenty-eight) days. For maintenance. 02/27/21   Livingston Rigg, MD  olmesartan  (BENICAR ) 20 MG tablet Take 20 mg by mouth daily. 09/18/21   [provider]  olmesartan -hydrochlorothiazide (BENICAR  HCT) 20-12.5 MG tablet  01/20/20   [provider]  Tapinarof  (VTAMA ) 1 % CREA Apply 1 Application topically daily. 02/10/22   Livingston Rigg, MD     No family history on file.  Social History   Socioeconomic History   Marital status: Married    Spouse name: Not on file   Number of children: Not on file   Years of education: Not on file   Highest education level: Not on file  Occupational History   Not on file  Tobacco Use   Smoking status: Never   Smokeless tobacco: Never  Vaping Use   Vaping status: Never Used  Substance and Sexual Activity   Alcohol use: No   Drug use: No   Sexual activity: Not on file  Other Topics Concern   Not on file  Social History Narrative   Not on file   Social Drivers of Health   Financial Resource Strain: Not on file  Food Insecurity: Not on file  Transportation Needs: Not on file  Physical Activity: Not on file  Stress: Not on file  Social Connections: Unknown (12/31/2021)   Received from [facility]   Social Network    Social Network: Not on file     Review of Systems: A 12 point ROS discussed and pertinent positives are indicated in the HPI above.  All  other systems are negative.  Vital Signs: BP (!) 162/90   Pulse 83   Temp 98 F (36.7 C) (Oral)   Resp 16   Ht 5' 8 (1.727 m)   Wt 190 lb (86.2 kg)   SpO2 99%   BMI 28.89 kg/m   Advance Care Plan: The advanced care place/surrogate decision maker was discussed at the time of visit and the patient did not wish to discuss or was not able to name a surrogate decision maker or provide an advance care plan.  Physical Exam Vitals reviewed.  Constitutional:      General: He is not in acute distress.    Appearance:  Normal appearance.  HENT:     Mouth/Throat:     Mouth: Mucous membranes are dry.  Cardiovascular:     Rate and Rhythm: Normal rate and regular rhythm.     Pulses: Normal pulses.     Heart sounds: Murmur heard.  Pulmonary:     Effort: Pulmonary effort is normal.  Abdominal:     General: Abdomen is flat.  Musculoskeletal:        General: Normal range of motion.     Cervical back: Normal range of motion.     Comments: No CVA tenderness bilaterally.  Skin:    General: Skin is warm and dry.  Neurological:     Mental Status: He is alert and oriented to person, place, and time.  Psychiatric:        Mood and Affect: Mood normal.        Behavior: Behavior normal.        Thought Content: Thought content normal.        Judgment: Judgment normal.     Imaging: No results found.  Labs:  CBC: Recent Labs    10/13/23 0730 05/20/24 0649  WBC 5.5 5.5  HGB 12.3* 12.3*  HCT 37.9* 38.3*  PLT 307 290    COAGS: Recent Labs    05/20/24 0649  INR 1.0    BMP: Recent Labs    10/13/23 0730  NA 140  K 3.7  CL 106  CO2 26  GLUCOSE 98  BUN 24*  CALCIUM  9.7  CREATININE 1.95*  GFRNONAA 39*    LIVER FUNCTION TESTS: Recent Labs    10/13/23 0730  BILITOT 0.4  AST 22  ALT 25  ALKPHOS 35*  PROT 8.9*  ALBUMIN 4.3    TUMOR MARKERS: No results for input(s): AFPTM, CEA, CA199, CHROMGRNA in the last 8760 hours.  Assessment and Plan: Few notes are available for review.  Patient had recently established with Nephrology at [facility] and recommended for random renal biopsy.  However, this was not scheduled for unknown reasons.  Per review of Dr. Delberta order request, patient has nephrotic range proteinuria, and positive ANA.  She has recommended random renal biopsy. Interventional Radiology was requested for random renal biopsy.  Patient presents for scheduled random renal biopsy in IR today.  Patient has been NPO since midnight.  All labs and  medications are within acceptable parameters.  No allergies listed to review.   Risks and benefits of random renal biopsy was discussed with the patient and/or patient's family including, but not limited to bleeding, infection, damage to adjacent structures or low yield requiring additional tests.  All of the questions were answered and there is agreement to proceed.  Consent signed and in chart.    Thank you for allowing our service to participate in Drayven Marchena 's  care.  Electronically Signed: Carlin DELENA Griffon, PA-C   05/20/2024, 8:08 AM      I spent a total of 30 Minutes in face to face in clinical consultation, greater than 50% of which was counseling/coordinating care for proteinuria, with consideration for random renal biopsy.

## 2024-05-20 ENCOUNTER — Other Ambulatory Visit: Payer: Self-pay

## 2024-05-20 ENCOUNTER — Ambulatory Visit (HOSPITAL_COMMUNITY)
Admission: RE | Admit: 2024-05-20 | Discharge: 2024-05-20 | Disposition: A | Discharge status: Home / Self Care | Source: Ambulatory Visit | Attending: Internal Medicine | Admitting: Internal Medicine

## 2024-05-20 DIAGNOSIS — D472 Monoclonal gammopathy: Secondary | ICD-10-CM | POA: Diagnosis not present

## 2024-05-20 DIAGNOSIS — I129 Hypertensive chronic kidney disease with stage 1 through stage 4 chronic kidney disease, or unspecified chronic kidney disease: Secondary | ICD-10-CM | POA: Diagnosis not present

## 2024-05-20 DIAGNOSIS — R808 Other proteinuria: Secondary | ICD-10-CM | POA: Diagnosis not present

## 2024-05-20 DIAGNOSIS — R809 Proteinuria, unspecified: Secondary | ICD-10-CM | POA: Diagnosis present

## 2024-05-20 DIAGNOSIS — M109 Gout, unspecified: Secondary | ICD-10-CM | POA: Diagnosis not present

## 2024-05-20 DIAGNOSIS — L409 Psoriasis, unspecified: Secondary | ICD-10-CM | POA: Diagnosis not present

## 2024-05-20 DIAGNOSIS — N183 Chronic kidney disease, stage 3 unspecified: Secondary | ICD-10-CM | POA: Insufficient documentation

## 2024-05-20 DIAGNOSIS — N289 Disorder of kidney and ureter, unspecified: Secondary | ICD-10-CM

## 2024-05-20 DIAGNOSIS — E785 Hyperlipidemia, unspecified: Secondary | ICD-10-CM | POA: Insufficient documentation

## 2024-05-20 LAB — PROTIME-INR
INR: 1 (ref 0.8–1.2)
Prothrombin Time: 14 s (ref 11.4–15.2)

## 2024-05-20 LAB — CBC
HCT: 38.3 % — ABNORMAL LOW (ref 39.0–52.0)
Hemoglobin: 12.3 g/dL — ABNORMAL LOW (ref 13.0–17.0)
MCH: 27.7 pg (ref 26.0–34.0)
MCHC: 32.1 g/dL (ref 30.0–36.0)
MCV: 86.3 fL (ref 80.0–100.0)
Platelets: 290 K/uL (ref 150–400)
RBC: 4.44 MIL/uL (ref 4.22–5.81)
RDW: 16.1 % — ABNORMAL HIGH (ref 11.5–15.5)
WBC: 5.5 K/uL (ref 4.0–10.5)
nRBC: 0 % (ref 0.0–0.2)

## 2024-05-20 MED ORDER — MIDAZOLAM HCL 2 MG/2ML IJ SOLN
INTRAMUSCULAR | Status: AC
  Filled 2024-05-20: qty 2

## 2024-05-20 MED ORDER — FENTANYL CITRATE (PF) 100 MCG/2ML IJ SOLN
INTRAMUSCULAR | Status: AC
  Filled 2024-05-20: qty 2

## 2024-05-20 MED ORDER — MIDAZOLAM HCL 2 MG/2ML IJ SOLN
INTRAMUSCULAR | Status: AC | PRN
  Administered 2024-05-20: .5 mg via INTRAVENOUS
  Administered 2024-05-20: 1 mg via INTRAVENOUS
  Administered 2024-05-20: .5 mg via INTRAVENOUS

## 2024-05-20 MED ORDER — FENTANYL CITRATE (PF) 100 MCG/2ML IJ SOLN
INTRAMUSCULAR | Status: AC | PRN
  Administered 2024-05-20: 25 ug via INTRAVENOUS
  Administered 2024-05-20: 50 ug via INTRAVENOUS
  Administered 2024-05-20: 25 ug via INTRAVENOUS

## 2024-05-20 MED ORDER — ACETAMINOPHEN 500 MG PO TABS: 500.0000 mg | ORAL_TABLET | Freq: Four times a day (QID) | ORAL | Status: DC | PRN

## 2024-05-20 MED ORDER — SODIUM CHLORIDE 0.9 % IV SOLN: INTRAVENOUS | Status: DC

## 2024-05-20 MED ORDER — LIDOCAINE HCL (PF) 1 % IJ SOLN
10.0000 mL | Freq: Once | INTRAMUSCULAR | Status: AC
Start: 2024-05-20 — End: 2024-05-20
  Administered 2024-05-20: 10 mL via INTRADERMAL

## 2024-05-20 MED ORDER — GELATIN ABSORBABLE 12-7 MM EX MISC
1.0000 | Freq: Once | CUTANEOUS | Status: AC
  Administered 2024-05-20: 1 via TOPICAL

## 2024-05-20 NOTE — Progress Notes (Signed)
 Discharge instructions reviewed with patient and wife at bedside denies questions or concerns. Biopsy site remained C/D/I, no s/s of complications. PT was able to tolerate PO intake. PT ambulated to the bathroom was able to void clear yellow urine. PT escorted from the unit via wheel chair to personal vehicle.

## 2024-05-20 NOTE — Procedures (Signed)
 Interventional Radiology Procedure:   Indications: Proteinuria  Procedure: US  guided left renal biopsy  Findings: 2 cores from left kidney lower pole  Complications: None     EBL: Minimal  Plan: Bedrest 3 hours  Margorie Renner R. Philip, MD  Pager: [phone]

## 2024-05-20 NOTE — Progress Notes (Signed)
 Discharge instructions reviewed with patient and wife at bedside denies questions or concerns. Biopsy site remained C/D/I, no s/s of complications. PT was able to tolerate PO intake.

## 2024-05-24 ENCOUNTER — Encounter (HOSPITAL_COMMUNITY): Payer: Self-pay

## 2024-05-24 LAB — SURGICAL PATHOLOGY
